# Patient Record
Sex: Male | Born: 1995 | Race: White | Hispanic: No | Marital: Single | Smoking: Former smoker
Health system: Southern US, Community
[De-identification: ages and names within clinical notes are randomized; demographics above are authoritative.]

---
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
## 2017-09-23 ENCOUNTER — Emergency Department (HOSPITAL_COMMUNITY)
Admission: EM | Admit: 2017-09-23 | Discharge: 2017-09-23 | Disposition: A | Discharge status: Home / Self Care | Payer: Self-pay | Attending: Emergency Medicine | Admitting: Emergency Medicine

## 2017-09-23 ENCOUNTER — Other Ambulatory Visit: Payer: Self-pay

## 2017-09-23 ENCOUNTER — Encounter (HOSPITAL_COMMUNITY): Payer: Self-pay | Admitting: Emergency Medicine

## 2017-09-23 DIAGNOSIS — F1510 Other stimulant abuse, uncomplicated: Secondary | ICD-10-CM | POA: Insufficient documentation

## 2017-09-23 DIAGNOSIS — Z79899 Other long term (current) drug therapy: Secondary | ICD-10-CM | POA: Insufficient documentation

## 2017-09-23 DIAGNOSIS — F17210 Nicotine dependence, cigarettes, uncomplicated: Secondary | ICD-10-CM | POA: Insufficient documentation

## 2017-09-23 LAB — COMPREHENSIVE METABOLIC PANEL
ALT: 180 U/L — ABNORMAL HIGH (ref 17–63)
ANION GAP: 10 (ref 5–15)
AST: 111 U/L — ABNORMAL HIGH (ref 15–41)
Albumin: 4.3 g/dL (ref 3.5–5.0)
Alkaline Phosphatase: 55 U/L (ref 38–126)
BUN: 26 mg/dL — ABNORMAL HIGH (ref 6–20)
CHLORIDE: 104 mmol/L (ref 101–111)
CO2: 21 mmol/L — AB (ref 22–32)
Calcium: 9.5 mg/dL (ref 8.9–10.3)
Creatinine, Ser: 1 mg/dL (ref 0.61–1.24)
GFR calc non Af Amer: >60 mL/min (ref >60)
Glucose, Bld: 89 mg/dL (ref 65–99)
Potassium: 3.7 mmol/L (ref 3.5–5.1)
SODIUM: 135 mmol/L (ref 135–145)
Total Bilirubin: 2.4 mg/dL — ABNORMAL HIGH (ref 0.3–1.2)
Total Protein: 7.5 g/dL (ref 6.5–8.1)

## 2017-09-23 LAB — CBC WITH DIFFERENTIAL/PLATELET
Basophils Absolute: 0 10*3/uL (ref 0.0–0.1)
Basophils Relative: 0 %
EOS ABS: 0.1 10*3/uL (ref 0.0–0.7)
Eosinophils Relative: 1 %
HCT: 37.4 % — ABNORMAL LOW (ref 39.0–52.0)
Hemoglobin: 12.7 g/dL — ABNORMAL LOW (ref 13.0–17.0)
LYMPHS ABS: 1.7 10*3/uL (ref 0.7–4.0)
LYMPHS PCT: 26 %
MCH: 30.2 pg (ref 26.0–34.0)
MCHC: 34 g/dL (ref 30.0–36.0)
MCV: 89 fL (ref 78.0–100.0)
Monocytes Absolute: 0.8 10*3/uL (ref 0.1–1.0)
Monocytes Relative: 11 %
Neutro Abs: 4 10*3/uL (ref 1.7–7.7)
Neutrophils Relative %: 62 %
Platelets: 154 10*3/uL (ref 150–400)
RBC: 4.2 MIL/uL — AB (ref 4.22–5.81)
RDW: 13.5 % (ref 11.5–15.5)
WBC: 6.6 10*3/uL (ref 4.0–10.5)

## 2017-09-23 LAB — RAPID URINE DRUG SCREEN, HOSP PERFORMED
Amphetamines: POSITIVE — AB
BENZODIAZEPINES: NOT DETECTED
Barbiturates: NOT DETECTED
Cocaine: NOT DETECTED
OPIATES: POSITIVE — AB
TETRAHYDROCANNABINOL: NOT DETECTED

## 2017-09-23 LAB — AMMONIA: Ammonia: 21 umol/L (ref 9–35)

## 2017-09-23 LAB — ETHANOL: Alcohol, Ethyl (B): <10 mg/dL (ref <10)

## 2017-09-23 NOTE — ED Notes (Signed)
Pt aware of need for urine specimen. Pt given urinal.

## 2017-09-23 NOTE — ED Notes (Addendum)
Patient is jerking and uneasy and says he is trying to remember what's going on and what day it is. Unable to get updated BP on him at this time.

## 2017-09-23 NOTE — ED Notes (Signed)
Bed: WA17 Expected date:  Expected time:  Means of arrival:  Comments: EMS 

## 2017-09-23 NOTE — ED Notes (Signed)
Patient arrives tearful and unable to remember the details from the last day.
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
## 2017-09-24 LAB — HIV ANTIBODY (ROUTINE TESTING W REFLEX): HIV Screen 4th Generation wRfx: NONREACTIVE

## 2017-09-24 LAB — HEPATITIS PANEL, ACUTE
HEP B S AG: NEGATIVE
Hep A IgM: NEGATIVE
Hep B C IgM: NEGATIVE

## 2019-03-07 ENCOUNTER — Emergency Department (HOSPITAL_COMMUNITY)
Admission: EM | Admit: 2019-03-07 | Discharge: 2019-03-07 | Disposition: A | Discharge status: Home / Self Care | Payer: Self-pay | Attending: Emergency Medicine | Admitting: Emergency Medicine

## 2019-03-07 ENCOUNTER — Ambulatory Visit (HOSPITAL_COMMUNITY)
Admission: EM | Admit: 2019-03-07 | Discharge: 2019-03-07 | Disposition: A | Discharge status: Home / Self Care | Payer: No Typology Code available for payment source | Attending: Emergency Medicine | Admitting: Emergency Medicine

## 2019-03-07 ENCOUNTER — Encounter (HOSPITAL_COMMUNITY): Payer: Self-pay | Admitting: Emergency Medicine

## 2019-03-07 DIAGNOSIS — F17210 Nicotine dependence, cigarettes, uncomplicated: Secondary | ICD-10-CM | POA: Insufficient documentation

## 2019-03-07 DIAGNOSIS — Z0441 Encounter for examination and observation following alleged adult rape: Secondary | ICD-10-CM | POA: Insufficient documentation

## 2019-03-07 DIAGNOSIS — T7421XA Adult sexual abuse, confirmed, initial encounter: Secondary | ICD-10-CM | POA: Insufficient documentation

## 2019-03-07 MED ORDER — ACETAMINOPHEN 500 MG PO TABS
1000.0000 mg | ORAL_TABLET | Freq: Once | ORAL | Status: AC
  Administered 2019-03-07: 15:00:00 1000 mg via ORAL
  Filled 2019-03-07: qty 2

## 2019-03-07 MED ORDER — CEFTRIAXONE SODIUM 250 MG IJ SOLR
250.0000 mg | Freq: Once | INTRAMUSCULAR | Status: AC
  Administered 2019-03-07: 16:00:00 250 mg via INTRAMUSCULAR
  Filled 2019-03-07: qty 250

## 2019-03-07 MED ORDER — AZITHROMYCIN 250 MG PO TABS
1000.0000 mg | ORAL_TABLET | Freq: Once | ORAL | Status: AC
Start: 2019-03-07 — End: 2019-03-07
  Administered 2019-03-07: 16:00:00 1000 mg via ORAL
  Filled 2019-03-07: qty 4

## 2019-03-07 MED ORDER — METRONIDAZOLE 500 MG PO TABS
2000.0000 mg | ORAL_TABLET | Freq: Once | ORAL | Status: AC
Start: 2019-03-07 — End: 2019-03-07
  Administered 2019-03-07: 16:00:00 2000 mg via ORAL
  Filled 2019-03-07: qty 4

## 2019-03-07 MED ORDER — LIDOCAINE HCL (PF) 1 % IJ SOLN
INTRAMUSCULAR | Status: AC
  Filled 2019-03-07: qty 5

## 2019-03-07 MED ORDER — LIDOCAINE HCL (PF) 1 % IJ SOLN
0.9000 mL | Freq: Once | INTRAMUSCULAR | Status: AC
  Administered 2019-03-07: 16:00:00 0.9 mL

## 2019-03-07 NOTE — ED Notes (Signed)
Tray ordered for patient by this RN. 
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
## 2019-03-07 NOTE — SANE Note (Signed)
Patient resting with eyes closed.  Rouses with verbal stimulation and falls back to sleep quickly.  Attempts made to discuss options and seek consent but patient unable to remain awake at this time.  The patient answered 'yes' when asked if he would like to get some rest before proceeding with the exam.  The patient requested that this RN stay with him for a bit.  Patient continues to sleep, rouses easily but quickly falls asleep.   This RN remained with the patient until 1245 pm and then notified the patient, the patient's RN, and the provider that this RN would come back when the patient was more alert.

## 2019-03-07 NOTE — ED Notes (Signed)
Sane nurse was called and is currently at [facility] will be coming here next.

## 2019-03-07 NOTE — ED Notes (Signed)
Medications given to SANE RN for administration.
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
## 2021-01-08 ENCOUNTER — Encounter (HOSPITAL_COMMUNITY): Payer: Self-pay

## 2021-01-08 ENCOUNTER — Emergency Department (HOSPITAL_COMMUNITY): Payer: Self-pay

## 2021-01-08 ENCOUNTER — Emergency Department (HOSPITAL_COMMUNITY)
Admission: EM | Admit: 2021-01-08 | Discharge: 2021-01-08 | Disposition: A | Discharge status: Home / Self Care | Payer: Self-pay | Attending: Emergency Medicine | Admitting: Emergency Medicine

## 2021-01-08 ENCOUNTER — Other Ambulatory Visit: Payer: Self-pay

## 2021-01-08 DIAGNOSIS — E162 Hypoglycemia, unspecified: Secondary | ICD-10-CM | POA: Insufficient documentation

## 2021-01-08 DIAGNOSIS — Z87891 Personal history of nicotine dependence: Secondary | ICD-10-CM | POA: Insufficient documentation

## 2021-01-08 DIAGNOSIS — T401X1A Poisoning by heroin, accidental (unintentional), initial encounter: Secondary | ICD-10-CM | POA: Insufficient documentation

## 2021-01-08 LAB — CBC WITH DIFFERENTIAL/PLATELET
Abs Immature Granulocytes: 0.12 10*3/uL — ABNORMAL HIGH (ref 0.00–0.07)
Basophils Absolute: 0 10*3/uL (ref 0.0–0.1)
Basophils Relative: 0 %
Eosinophils Absolute: 0 10*3/uL (ref 0.0–0.5)
Eosinophils Relative: 0 %
HCT: 42.8 % (ref 39.0–52.0)
Hemoglobin: 13.4 g/dL (ref 13.0–17.0)
Immature Granulocytes: 1 %
Lymphocytes Relative: 5 %
Lymphs Abs: 0.8 10*3/uL (ref 0.7–4.0)
MCH: 30.5 pg (ref 26.0–34.0)
MCHC: 31.3 g/dL (ref 30.0–36.0)
MCV: 97.3 fL (ref 80.0–100.0)
Monocytes Absolute: 1.1 10*3/uL — ABNORMAL HIGH (ref 0.1–1.0)
Monocytes Relative: 8 %
Neutro Abs: 12 10*3/uL — ABNORMAL HIGH (ref 1.7–7.7)
Neutrophils Relative %: 86 %
Platelets: 166 10*3/uL (ref 150–400)
RBC: 4.4 MIL/uL (ref 4.22–5.81)
RDW: 14.3 % (ref 11.5–15.5)
WBC: 14 10*3/uL — ABNORMAL HIGH (ref 4.0–10.5)
nRBC: 0 % (ref 0.0–0.2)

## 2021-01-08 LAB — RAPID URINE DRUG SCREEN, HOSP PERFORMED
Amphetamines: NOT DETECTED
Barbiturates: NOT DETECTED
Benzodiazepines: NOT DETECTED
Cocaine: NOT DETECTED
Opiates: NOT DETECTED
Tetrahydrocannabinol: POSITIVE — AB

## 2021-01-08 LAB — COMPREHENSIVE METABOLIC PANEL
ALT: 141 U/L — ABNORMAL HIGH (ref 0–44)
AST: 69 U/L — ABNORMAL HIGH (ref 15–41)
Albumin: 4 g/dL (ref 3.5–5.0)
Alkaline Phosphatase: 65 U/L (ref 38–126)
Anion gap: 7 (ref 5–15)
BUN: 19 mg/dL (ref 6–20)
CO2: 27 mmol/L (ref 22–32)
Calcium: 8.4 mg/dL — ABNORMAL LOW (ref 8.9–10.3)
Chloride: 106 mmol/L (ref 98–111)
Creatinine, Ser: 1.15 mg/dL (ref 0.61–1.24)
GFR, Estimated: >60 mL/min (ref >60)
Glucose, Bld: 69 mg/dL — ABNORMAL LOW (ref 70–99)
Potassium: 4.4 mmol/L (ref 3.5–5.1)
Sodium: 140 mmol/L (ref 135–145)
Total Bilirubin: 0.8 mg/dL (ref 0.3–1.2)
Total Protein: 7.5 g/dL (ref 6.5–8.1)

## 2021-01-08 LAB — CBG MONITORING, ED
Glucose-Capillary: 57 mg/dL — ABNORMAL LOW (ref 70–99)
Glucose-Capillary: 73 mg/dL (ref 70–99)

## 2021-01-08 LAB — ETHANOL: Alcohol, Ethyl (B): <10 mg/dL (ref <10)

## 2021-01-08 MED ORDER — NALOXONE HCL 4 MG/0.1ML NA LIQD
1.0000 | Freq: Once | NASAL | Status: DC
  Filled 2021-01-08: qty 4

## 2021-01-08 NOTE — ED Notes (Signed)
Pt given orange juice with two sugar packets as well as sandwich and popsickle. Pt witnessed finishing food and drink.
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
## 2021-01-08 NOTE — ED Provider Notes (Signed)
[facility]   CSN: [id number] Arrival date & time: 01/08/21  1848     History No chief complaint on file.   Bradley Dunn is a 25 y.o. male.  HPI 24 year old male presents after being found unresponsive by his girlfriend and being given intranasal Narcan.  The patient tells me that he has been clean for 7 months and "slipped up" and injected heroin today.  He states that his girlfriend apparently found him unresponsive and he does not know what happened after that.  Right now he feels tired but states he also worked all day in the heat.  He denies feeling short of breath or having any chest pain.  He does have a headache since he woke up.  No vomiting or nausea.  He was not trying to harm himself.  History reviewed. No pertinent past medical history.  There are no problems to display for this patient.   History reviewed. No pertinent surgical history.     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  Review of Systems   Review of Systems  Constitutional:  Negative for fever.  Respiratory:  Negative for shortness of breath.   Gastrointestinal:  Negative for nausea and vomiting.  Neurological:  Positive for headaches.  Psychiatric/Behavioral:  Negative for suicidal ideas.   All other systems reviewed and are negative.  Physical Exam Updated Vital Signs BP 139/70   Pulse 75   Temp 97.8 F (36.6 C) (Oral)   Resp 16   Ht 6' (1.829 m)   Wt 97.5 kg   SpO2 94%   BMI 29.16 kg/m   Physical  Exam Vitals and nursing note reviewed.  Constitutional:      General: He is not in acute distress.    Appearance: He is well-developed. He is not ill-appearing or diaphoretic.     Comments: Patient is awake, alert, though throughout the history/exam he is now starting to drift off and fall asleep unless I stimulate him and wake him up  HENT:     Head: Normocephalic and atraumatic.     Right Ear: External ear normal.     Left Ear: External ear normal.     Nose: Nose normal.  Eyes:     General:        Right eye: No discharge.        Left eye: No discharge.     Pupils: Pupils are equal, round, and reactive to light.  Cardiovascular:     Rate and Rhythm: Normal rate and regular rhythm.     Heart sounds: Normal heart sounds.  Pulmonary:     Effort: Pulmonary effort is normal.     Breath sounds: Normal breath sounds. No wheezing.  Abdominal:     Palpations: Abdomen is soft.     Tenderness: There is no abdominal tenderness.  Musculoskeletal:     Cervical back: Neck supple.  Skin:  General: Skin is warm and dry.  Neurological:     Mental Status: He is alert.  Psychiatric:        Mood and Affect: Mood is not anxious.    ED Results / Procedures / Treatments   Labs (all labs ordered are listed, but only abnormal results are displayed) Labs Reviewed  CBC WITH DIFFERENTIAL/PLATELET - Abnormal; Notable for the following components:      Result Value   WBC 14.0 (*)    Neutro Abs 12.0 (*)    Monocytes Absolute 1.1 (*)    Abs Immature Granulocytes 0.12 (*)    All other components within normal limits  COMPREHENSIVE METABOLIC PANEL - Abnormal; Notable for the following components:   Glucose, Bld 69 (*)    Calcium 8.4 (*)    AST 69 (*)    ALT 141 (*)    All other components within normal limits  RAPID URINE DRUG SCREEN, HOSP PERFORMED - Abnormal; Notable for the following components:   Tetrahydrocannabinol POSITIVE (*)    All other components within normal limits  CBG  MONITORING, ED - Abnormal; Notable for the following components:   Glucose-Capillary 57 (*)    All other components within normal limits  ETHANOL  CBG MONITORING, ED    EKG EKG Interpretation  Date/Time:  Thursday January 08 2021 19:49:30 EDT Ventricular Rate:  89 PR Interval:  155 QRS Duration: 88 QT Interval:  346 QTC Calculation: 421 R Axis:   80 Text Interpretation: Sinus rhythm Biatrial enlargement RSR' in V1 or V2, probably normal variant no significant change since 2019 Confirmed by Pricilla Loveless [phone]) on 01/08/2021 8:04:51 PM  Radiology DG Chest Portable 1 View  Result Date: 01/08/2021 CLINICAL DATA:  Patient was found unresponsive. Hypoxia after Narcan administration. EXAM: PORTABLE CHEST 1 VIEW COMPARISON:  None. FINDINGS: The heart size and mediastinal contours are within normal limits. Both lungs are clear. The visualized skeletal structures are unremarkable. IMPRESSION: No active disease. Electronically Signed   By: Burman Nieves M.D.   On: 01/08/2021 20:10    Procedures Procedures   Medications Ordered in ED Medications  naloxone (NARCAN) nasal spray 4 mg/0.1 mL (has no administration in time range)    ED Course  I have reviewed the triage vital signs and the nursing notes.  Pertinent labs & imaging results that were available during my care of the patient were reviewed by me and considered in my medical decision making (see chart for details).    MDM Rules/Calculators/A&P                          Patient presents after an accidental heroin overdose.  He was given Narcan by his girlfriend.  Since being here he has had some episodes where he is sleeping and some mild hypoxia but he has not become apneic or had significant decreased respirations.  He easily awakens and started on thing more Narcan is given.  He was observed and eventually came off oxygen and is a little more awake.  However he is feeling a little weak and lightheaded and found to be mildly  hypoglycemic, which came up with food including a sandwich.  He is feeling well enough for discharge.  I do not think he is going to need more Narcan given he has been observed for multiple hours and think he is stable for discharge home with return precautions. Final Clinical Impression(s) / ED Diagnoses Final diagnoses:  Accidental overdose of  heroin, initial encounter [facility])  Hypoglycemia    Rx / DC Orders ED Discharge Orders     None        Pricilla Loveless, MD 01/08/21 2258

## 2021-01-20 ENCOUNTER — Emergency Department (HOSPITAL_BASED_OUTPATIENT_CLINIC_OR_DEPARTMENT_OTHER)
Admission: EM | Admit: 2021-01-20 | Discharge: 2021-01-21 | Discharge status: Against Medical Advice | Payer: Self-pay | Attending: Emergency Medicine | Admitting: Emergency Medicine

## 2021-01-20 ENCOUNTER — Encounter (HOSPITAL_BASED_OUTPATIENT_CLINIC_OR_DEPARTMENT_OTHER): Payer: Self-pay | Admitting: Obstetrics and Gynecology

## 2021-01-20 ENCOUNTER — Other Ambulatory Visit: Payer: Self-pay

## 2021-01-20 ENCOUNTER — Emergency Department (HOSPITAL_BASED_OUTPATIENT_CLINIC_OR_DEPARTMENT_OTHER): Payer: Self-pay

## 2021-01-20 DIAGNOSIS — S0081XA Abrasion of other part of head, initial encounter: Secondary | ICD-10-CM | POA: Insufficient documentation

## 2021-01-20 DIAGNOSIS — Z20822 Contact with and (suspected) exposure to covid-19: Secondary | ICD-10-CM | POA: Insufficient documentation

## 2021-01-20 DIAGNOSIS — Z87891 Personal history of nicotine dependence: Secondary | ICD-10-CM | POA: Insufficient documentation

## 2021-01-20 DIAGNOSIS — W5503XA Scratched by cat, initial encounter: Secondary | ICD-10-CM | POA: Insufficient documentation

## 2021-01-20 DIAGNOSIS — S30811A Abrasion of abdominal wall, initial encounter: Secondary | ICD-10-CM | POA: Insufficient documentation

## 2021-01-20 DIAGNOSIS — A419 Sepsis, unspecified organism: Secondary | ICD-10-CM | POA: Insufficient documentation

## 2021-01-20 DIAGNOSIS — S20419A Abrasion of unspecified back wall of thorax, initial encounter: Secondary | ICD-10-CM | POA: Insufficient documentation

## 2021-01-20 LAB — COMPREHENSIVE METABOLIC PANEL
ALT: 114 U/L — ABNORMAL HIGH (ref 0–44)
AST: 70 U/L — ABNORMAL HIGH (ref 15–41)
Albumin: 4.3 g/dL (ref 3.5–5.0)
Alkaline Phosphatase: 62 U/L (ref 38–126)
Anion gap: 8 (ref 5–15)
BUN: 27 mg/dL — ABNORMAL HIGH (ref 6–20)
CO2: 26 mmol/L (ref 22–32)
Calcium: 9.2 mg/dL (ref 8.9–10.3)
Chloride: 95 mmol/L — ABNORMAL LOW (ref 98–111)
Creatinine, Ser: 1.22 mg/dL (ref 0.61–1.24)
GFR, Estimated: >60 mL/min (ref >60)
Glucose, Bld: 106 mg/dL — ABNORMAL HIGH (ref 70–99)
Potassium: 4.3 mmol/L (ref 3.5–5.1)
Sodium: 129 mmol/L — ABNORMAL LOW (ref 135–145)
Total Bilirubin: 1.4 mg/dL — ABNORMAL HIGH (ref 0.3–1.2)
Total Protein: 8 g/dL (ref 6.5–8.1)

## 2021-01-20 LAB — CBC WITH DIFFERENTIAL/PLATELET
Abs Immature Granulocytes: 0.01 10*3/uL (ref 0.00–0.07)
Basophils Absolute: 0 10*3/uL (ref 0.0–0.1)
Basophils Relative: 0 %
Eosinophils Absolute: 0 10*3/uL (ref 0.0–0.5)
Eosinophils Relative: 0 %
HCT: 45.3 % (ref 39.0–52.0)
Hemoglobin: 15.1 g/dL (ref 13.0–17.0)
Immature Granulocytes: 0 %
Lymphocytes Relative: 13 %
Lymphs Abs: 1 10*3/uL (ref 0.7–4.0)
MCH: 30.1 pg (ref 26.0–34.0)
MCHC: 33.3 g/dL (ref 30.0–36.0)
MCV: 90.4 fL (ref 80.0–100.0)
Monocytes Absolute: 0.8 10*3/uL (ref 0.1–1.0)
Monocytes Relative: 10 %
Neutro Abs: 5.8 10*3/uL (ref 1.7–7.7)
Neutrophils Relative %: 77 %
Platelets: 173 10*3/uL (ref 150–400)
RBC: 5.01 MIL/uL (ref 4.22–5.81)
RDW: 13.5 % (ref 11.5–15.5)
WBC: 7.6 10*3/uL (ref 4.0–10.5)
nRBC: 0 % (ref 0.0–0.2)

## 2021-01-20 LAB — URINALYSIS, ROUTINE W REFLEX MICROSCOPIC
Bilirubin Urine: NEGATIVE
Glucose, UA: NEGATIVE mg/dL
Hgb urine dipstick: NEGATIVE
Ketones, ur: 15 mg/dL — AB
Leukocytes,Ua: NEGATIVE
Nitrite: NEGATIVE
Protein, ur: 30 mg/dL — AB
Specific Gravity, Urine: 1.028 (ref 1.005–1.030)
pH: 5.5 (ref 5.0–8.0)

## 2021-01-20 LAB — RESP PANEL BY RT-PCR (FLU A&B, COVID) ARPGX2
Influenza A by PCR: NEGATIVE
Influenza B by PCR: NEGATIVE
SARS Coronavirus 2 by RT PCR: NEGATIVE

## 2021-01-20 LAB — LACTIC ACID, PLASMA: Lactic Acid, Venous: 1.2 mmol/L (ref 0.5–1.9)

## 2021-01-20 MED ORDER — VANCOMYCIN HCL 1750 MG/350ML IV SOLN
1750.0000 mg | Freq: Two times a day (BID) | INTRAVENOUS | Status: DC
  Filled 2021-01-20: qty 350

## 2021-01-20 MED ORDER — LACTATED RINGERS IV SOLN: INTRAVENOUS | Status: DC

## 2021-01-20 MED ORDER — SODIUM CHLORIDE 0.9 % IV SOLN
2.0000 g | Freq: Once | INTRAVENOUS | Status: AC
Start: 2021-01-20 — End: 2021-01-20
  Administered 2021-01-20: 2 g via INTRAVENOUS
  Filled 2021-01-20: qty 2

## 2021-01-20 MED ORDER — LACTATED RINGERS IV BOLUS (SEPSIS)
1000.0000 mL | Freq: Once | INTRAVENOUS | Status: AC
  Administered 2021-01-20: 1000 mL via INTRAVENOUS

## 2021-01-20 MED ORDER — VANCOMYCIN HCL IN DEXTROSE 1-5 GM/200ML-% IV SOLN
1000.0000 mg | INTRAVENOUS | Status: AC
  Administered 2021-01-21: 1000 mg via INTRAVENOUS
  Filled 2021-01-20: qty 200

## 2021-01-20 MED ORDER — LACTATED RINGERS IV BOLUS (SEPSIS)
1000.0000 mL | Freq: Once | INTRAVENOUS | Status: AC
  Administered 2021-01-21: 1000 mL via INTRAVENOUS

## 2021-01-20 MED ORDER — SODIUM CHLORIDE 0.9 % IV SOLN: 2.0000 g | Freq: Three times a day (TID) | INTRAVENOUS | Status: DC

## 2021-01-20 MED ORDER — LACTATED RINGERS IV BOLUS (SEPSIS): 1000.0000 mL | Freq: Once | INTRAVENOUS | Status: DC

## 2021-01-20 MED ORDER — METRONIDAZOLE 500 MG/100ML IV SOLN
500.0000 mg | Freq: Once | INTRAVENOUS | Status: AC
  Administered 2021-01-20: 500 mg via INTRAVENOUS
  Filled 2021-01-20: qty 100

## 2021-01-20 MED ORDER — ACETAMINOPHEN 325 MG PO TABS
650.0000 mg | ORAL_TABLET | Freq: Once | ORAL | Status: AC
  Administered 2021-01-20: 650 mg via ORAL
  Filled 2021-01-20: qty 2

## 2021-01-20 MED ORDER — VANCOMYCIN HCL IN DEXTROSE 1-5 GM/200ML-% IV SOLN: 1000.0000 mg | Freq: Once | INTRAVENOUS | Status: DC

## 2021-01-20 NOTE — ED Triage Notes (Signed)
Patient's cat attacked his dog and and he tried to stop the cat and he has an area concerning for infection on the left arm and scratches on his face

## 2021-01-20 NOTE — Progress Notes (Signed)
Pharmacy Antibiotic Note  Shafer Swamy is a 25 y.o. male admitted on 01/20/2021 with  wound infection .  Pharmacy has been consulted for cefepime and vancomycin dosing.  Plan: Cefepime 2g q8h Vancomycin 2g x 1 then Vancomycin 1750 IV every 12 hours.  Goal trough 15-20 mcg/mL. Pharmacy to adjust dosing / frequency as needed per renal function Follow up cultures for antibiotic streamlining opportunities     Temp (24hrs), Avg:101.3 F (38.5 C), Min:100.3 F (37.9 C), Max:102.1 F (38.9 C)  Recent Labs  Lab 01/20/21 2200  WBC 7.6    Estimated Creatinine Clearance: 118.9 mL/min (by C-G formula based on SCr of 1.15 mg/dL).    No Known Allergies  Antimicrobials this admission: cefepime 6/28 >> vancomycin 6/28 >> Metronidazole 6/28 >>  Microbiology results: Pending  Thank you for allowing pharmacy to be a part of this patient's care.  Delmar Landau, PharmD, BCPS 01/20/2021 11:13 PM ED Clinical Pharmacist -  [phone]

## 2021-01-20 NOTE — ED Provider Notes (Addendum)
[facility] EMERGENCY DEPT Provider Note   CSN: [id number] Arrival date & time: 01/20/21  1832     History Chief Complaint  Patient presents with   Animal Bite    Bradley Dunn is a 25 y.o. male.  Patient status post multiple cat scratches on Saturday.  Started to feel ill yesterday.  Patient also seen June 16 for accidental heroin overdose requiring Narcan.  Patient claimed that he had been dry for a long period of time until then.  Also the cat is feral.  But the cat is acting normal.  Has not run away.  Patient arrived here with fever and tachycardia.  Needing criteria for sepsis.      History reviewed. No pertinent past medical history.  There are no problems to display for this patient.   History reviewed. No pertinent surgical history.     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  Review of Systems   Review of Systems  Constitutional:  Positive for fatigue and fever. Negative for chills.  HENT:  Negative for ear pain and sore throat.   Eyes:  Negative for pain and visual disturbance.  Respiratory:  Negative for cough and shortness of breath.   Cardiovascular:  Negative for chest pain and palpitations.  Gastrointestinal:  Negative for abdominal pain and vomiting.  Genitourinary:  Negative for dysuria and hematuria.  Musculoskeletal:  Negative for arthralgias and back pain.  Skin:  Negative for color change and rash.  Neurological:  Negative for seizures and syncope.  All other systems reviewed and are  negative.  Physical Exam Updated Vital Signs BP 128/68   Pulse 92   Temp (!) 102.1 F (38.9 C) (Oral)   Resp 15   SpO2 95%   Physical Exam Vitals and nursing note reviewed.  Constitutional:      Appearance: Normal appearance. He is well-developed. He is ill-appearing.  HENT:     Head: Normocephalic and atraumatic.     Mouth/Throat:     Mouth: Mucous membranes are moist.  Eyes:     Conjunctiva/sclera: Conjunctivae normal.  Cardiovascular:     Rate and Rhythm: Normal rate and regular rhythm.     Heart sounds: No murmur heard. Pulmonary:     Effort: Pulmonary effort is normal. No respiratory distress.     Breath sounds: Normal breath sounds.  Abdominal:     Palpations: Abdomen is soft.  Musculoskeletal:        General: No swelling.     Cervical back: Neck supple.  Skin:    General: Skin is warm and dry.     Comments: Multiple scratches to the head forehead upper back and abdomen.  No lymphadenopathy.  No signs of any wound infection.  Neurological:     General: No focal deficit present.     Mental Status: He is alert and oriented to person, place, and time.     Cranial Nerves: No  cranial nerve deficit.     Sensory: No sensory deficit.    ED Results / Procedures / Treatments   Labs (all labs ordered are listed, but only abnormal results are displayed) Labs Reviewed  COMPREHENSIVE METABOLIC PANEL - Abnormal; Notable for the following components:      Result Value   Sodium 129 (*)    Chloride 95 (*)    Glucose, Bld 106 (*)    BUN 27 (*)    AST 70 (*)    ALT 114 (*)    Total Bilirubin 1.4 (*)    All other components within normal limits  URINALYSIS, ROUTINE W REFLEX MICROSCOPIC - Abnormal; Notable for the following components:   APPearance HAZY (*)    Ketones, ur 15 (*)    Protein, ur 30 (*)    All other components within normal limits  RESP PANEL BY RT-PCR (FLU A&B, COVID) ARPGX2  CULTURE, BLOOD (ROUTINE X 2)  CULTURE, BLOOD (ROUTINE X 2)  URINE CULTURE   LACTIC ACID, PLASMA  CBC WITH DIFFERENTIAL/PLATELET  LACTIC ACID, PLASMA  PROTIME-INR  APTT    EKG None ED ECG REPORT   Date: 01/21/2021  Rate: 105  Rhythm: sinus tachycardia  QRS Axis: normal  Intervals: normal  ST/T Wave abnormalities: early repolarization  Conduction Disutrbances:none  Narrative Interpretation:   Old EKG Reviewed: none available  I have personally reviewed the EKG tracing and agree with the computerized printout as noted.   Radiology DG Chest Port 1 View  Result Date: 01/20/2021 CLINICAL DATA:  Cat scratches on left arm and face, concern for infection EXAM: PORTABLE CHEST 1 VIEW COMPARISON:  01/08/2021 FINDINGS: No consolidation, features of edema, pneumothorax, or effusion. Pulmonary vascularity is normally distributed. The cardiomediastinal contours are unremarkable. No acute osseous or soft tissue abnormality. IMPRESSION: No acute cardiopulmonary abnormality. Electronically Signed   By: Kreg Shropshire M.D.   On: 01/20/2021 23:08    Procedures Procedures   CRITICAL CARE Performed by: Vanetta Mulders Total critical care time: 40 minutes Critical care time was exclusive of separately billable procedures and treating other patients. Critical care was necessary to treat or prevent imminent or life-threatening deterioration. Critical care was time spent personally by me on the following activities: development of treatment plan with patient and/or surrogate as well as nursing, discussions with consultants, evaluation of patient's response to treatment, examination of patient, obtaining history from patient or surrogate, ordering and performing treatments and interventions, ordering and review of laboratory studies, ordering and review of radiographic studies, pulse oximetry and re-evaluation of patient's condition.   Medications Ordered in ED Medications  lactated ringers infusion (has no administration in time range)  lactated ringers bolus 1,000 mL (1,000  mLs Intravenous New Bag/Given 01/20/21 2320)    And  lactated ringers bolus 1,000 mL (1,000 mLs Intravenous New Bag/Given 01/21/21 0022)    And  lactated ringers bolus 1,000 mL (has no administration in time range)  vancomycin (VANCOCIN) IVPB 1000 mg/200 mL premix (1,000 mg Intravenous New Bag/Given 01/21/21 0024)  ceFEPIme (MAXIPIME) 2 g in sodium chloride 0.9 % 100 mL IVPB (has no administration in time range)  vancomycin (VANCOREADY) IVPB 1750 mg/350 mL (has no administration in time range)  acetaminophen (TYLENOL) tablet 650 mg (has no administration in time range)  ceFEPIme (MAXIPIME) 2 g in sodium chloride 0.9 % 100 mL IVPB (0 g Intravenous Stopped 01/20/21 2325)  metroNIDAZOLE (FLAGYL) IVPB 500 mg (500 mg Intravenous New Bag/Given 01/20/21 2322)  acetaminophen (TYLENOL) tablet 650 mg (650  mg Oral Given 01/20/21 2234)    ED Course  I have reviewed the triage vital signs and the nursing notes.  Pertinent labs & imaging results that were available during my care of the patient were reviewed by me and considered in my medical decision making (see chart for details).    MDM Rules/Calculators/A&P                         Patient with presenting vital signs consistent with sepsis.  With tachycardia and fever.  Never had any hypotension.  Temp was 101.5.  Heart rate 113.  Respirations 16 oxygen sats 97%.  Patient claims that he has had multiple scratches from the cat which seem to be extensive.  Include the face upper back abdomen.  There is no lymphadenopathy to be consistent with cat scratch fever.  Patient was seen on June 16 for accidental heroin overdose.  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  He stated that he had been sober for a period of time.  COVID testing and influenza testing negative.  Blood cultures have been drawn and are pending.  Lactic acid not elevated no leukocytosis.  Patient did receive the 30 cc keep kilo fluid.  Patient received broad-spectrum antibiotics.  On these he  feels better.  Chest x-ray negative.  Patient without any abdominal pain.  Some slight liver function test abnormalities.  In addition patient has no heart murmurs.  Discussed with hospitalist regarding admission for sepsis.  Final Clinical Impression(s) / ED Diagnoses Final diagnoses:  Sepsis, due to unspecified organism, unspecified whether acute organ dysfunction present [facility])    Rx / DC Orders ED Discharge Orders     None        Vanetta Mulders, MD 01/21/21 2119    Vanetta Mulders, MD 01/21/21 0100  Patient insisting on leaving AMA.  We will cancel the admission.  He understands that we are concerned about about blood infection.  And that this could be life-threatening. the symptoms do not fit into clinical cat scratch fever.  Says he will return if anything gets worse.    Vanetta Mulders, MD 01/21/21 [phone]

## 2021-01-21 MED ORDER — ACETAMINOPHEN 325 MG PO TABS: 650.0000 mg | ORAL_TABLET | Freq: Once | ORAL | Status: DC

## 2021-01-21 NOTE — Discharge Instructions (Signed)
Patient insisted on leaving.  He says he feels better after the antibiotics.  But I explained to him that they may not hold were concerned about an infection in his blood.  He says he will return for anything new or worse.

## 2021-01-22 LAB — URINE CULTURE

## 2021-01-26 LAB — CULTURE, BLOOD (ROUTINE X 2)
Culture: NO GROWTH
Culture: NO GROWTH
Special Requests: ADEQUATE
Special Requests: ADEQUATE

## 2021-04-24 ENCOUNTER — Emergency Department (HOSPITAL_COMMUNITY)
Admission: EM | Admit: 2021-04-24 | Discharge: 2021-04-25 | Disposition: A | Discharge status: Home / Self Care | Payer: Medicaid Other | Attending: Emergency Medicine | Admitting: Emergency Medicine

## 2021-04-24 ENCOUNTER — Other Ambulatory Visit: Payer: Self-pay

## 2021-04-24 DIAGNOSIS — R410 Disorientation, unspecified: Secondary | ICD-10-CM

## 2021-04-24 DIAGNOSIS — Y900 Blood alcohol level of less than 20 mg/100 ml: Secondary | ICD-10-CM | POA: Insufficient documentation

## 2021-04-24 DIAGNOSIS — R4182 Altered mental status, unspecified: Secondary | ICD-10-CM | POA: Insufficient documentation

## 2021-04-24 DIAGNOSIS — F1110 Opioid abuse, uncomplicated: Secondary | ICD-10-CM | POA: Insufficient documentation

## 2021-04-24 DIAGNOSIS — Z87891 Personal history of nicotine dependence: Secondary | ICD-10-CM | POA: Insufficient documentation

## 2021-04-24 DIAGNOSIS — F1290 Cannabis use, unspecified, uncomplicated: Secondary | ICD-10-CM | POA: Insufficient documentation

## 2021-04-24 DIAGNOSIS — R739 Hyperglycemia, unspecified: Secondary | ICD-10-CM | POA: Insufficient documentation

## 2021-04-24 LAB — CBC
HCT: 40.9 % (ref 39.0–52.0)
Hemoglobin: 13.2 g/dL (ref 13.0–17.0)
MCH: 29.3 pg (ref 26.0–34.0)
MCHC: 32.3 g/dL (ref 30.0–36.0)
MCV: 90.7 fL (ref 80.0–100.0)
Platelets: 167 10*3/uL (ref 150–400)
RBC: 4.51 MIL/uL (ref 4.22–5.81)
RDW: 13.1 % (ref 11.5–15.5)
WBC: 5 10*3/uL (ref 4.0–10.5)
nRBC: 0 % (ref 0.0–0.2)

## 2021-04-24 LAB — COMPREHENSIVE METABOLIC PANEL
ALT: 138 U/L — ABNORMAL HIGH (ref 0–44)
AST: 78 U/L — ABNORMAL HIGH (ref 15–41)
Albumin: 4.3 g/dL (ref 3.5–5.0)
Alkaline Phosphatase: 70 U/L (ref 38–126)
Anion gap: 7 (ref 5–15)
BUN: 15 mg/dL (ref 6–20)
CO2: 27 mmol/L (ref 22–32)
Calcium: 9.3 mg/dL (ref 8.9–10.3)
Chloride: 105 mmol/L (ref 98–111)
Creatinine, Ser: 1.07 mg/dL (ref 0.61–1.24)
GFR, Estimated: >60 mL/min (ref >60)
Glucose, Bld: 110 mg/dL — ABNORMAL HIGH (ref 70–99)
Potassium: 3.8 mmol/L (ref 3.5–5.1)
Sodium: 139 mmol/L (ref 135–145)
Total Bilirubin: 1.1 mg/dL (ref 0.3–1.2)
Total Protein: 8.5 g/dL — ABNORMAL HIGH (ref 6.5–8.1)

## 2021-04-24 LAB — CBG MONITORING, ED: Glucose-Capillary: 98 mg/dL (ref 70–99)

## 2021-04-24 NOTE — ED Triage Notes (Signed)
Pt came from home via POV. C/c: confusion, AMS, Pt states "I feel spacey". PMH of possible hepatitis, heroin use, hospitalization for possible sepsis. Pt's girlfriend states that pt has had 4, 15 minute episodes in which pt does not know or remember what he is doing, this normally happens when pt goes to the bathroom. Pt denies hx of seizures. Pt endorses heroin use off and on for the last 10 years, last use was in June.

## 2021-04-24 NOTE — ED Notes (Signed)
PT HAS BLOOD IN MAIN LAB IF NEEDED  2 GOLDS 1 LAV 1 LG

## 2021-04-25 LAB — ETHANOL: Alcohol, Ethyl (B): <10 mg/dL (ref <10)

## 2021-04-25 LAB — CBG MONITORING, ED: Glucose-Capillary: 124 mg/dL — ABNORMAL HIGH (ref 70–99)

## 2021-04-25 LAB — RAPID URINE DRUG SCREEN, HOSP PERFORMED
Amphetamines: POSITIVE — AB
Barbiturates: NOT DETECTED
Benzodiazepines: NOT DETECTED
Cocaine: NOT DETECTED
Opiates: NOT DETECTED
Tetrahydrocannabinol: POSITIVE — AB

## 2021-04-25 NOTE — ED Provider Notes (Signed)
[facility]-EMERGENCY DEPT Provider Note   CSN: [id number] Arrival date & time: 04/24/21  2135     History Chief Complaint  Patient presents with   Altered Mental Status    Bradley Dunn is a 25 y.o. male.  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  His girlfriend is at bedside and supplements his history.  When asked what brought him in tonight, patient states "I do not really know."  Girlfriend describes several episodes of amnesia and confusion.  She states over the last 6 months he has had at least 4 episodes where he has been unaware of what is going on for approximately 15 minutes.  She states that this has happened and correlates with him going into the bathroom.  She states he comes out and has abnormal movements and does not seem to respond appropriately.  He is then amnestic to the event.  Patient denies any current drug use.  No history of seizures.  He has no physical complaints at this time.  Does state that he feels "spacey."  Also reports recent history of a sinus infection.  No fevers or known sick contacts.  Does endorse history of traumatic childhood and girlfriend questions whether he may have some PTSD.  No past medical history on file.  There are no problems to display for this patient.   No past surgical history on file.     Family History  Family history unknown: Yes    Social History   Tobacco Use   Smoking status: Former    Packs/day: 0.50    Types: Cigarettes   Smokeless tobacco: Never  Vaping Use   Vaping Use: Every day   Substances: Nicotine, Flavoring  Substance Use Topics   Alcohol use: No   Drug use: Yes    Types: IV    Comment: heroin    Home Medications Prior to Admission medications   Not on File    Allergies    Patient has no known allergies.  Review of Systems   Review of Systems  Constitutional:   Negative for fever.  Respiratory:  Negative for shortness of breath.   Cardiovascular:  Negative for chest pain.  Gastrointestinal:  Negative for abdominal pain, nausea and vomiting.  Psychiatric/Behavioral:  Positive for confusion and dysphoric mood. Negative for self-injury and suicidal ideas.   All other systems reviewed and are negative.  Physical Exam Updated Vital Signs BP (!) 132/94   Pulse 85   Temp 98.2 F (36.8 C) (Oral)   Resp 18   Ht 1.829 m (6')   Wt 97.5 kg   SpO2 100%   BMI 29.16 kg/m   Physical Exam Vitals and nursing note reviewed.  Constitutional:      Appearance: He is well-developed. He is not ill-appearing.  HENT:     Head: Normocephalic and atraumatic.     Nose: Nose normal.     Mouth/Throat:     Mouth: Mucous membranes are moist.  Eyes:     Pupils: Pupils are equal, round, and reactive to light.  Cardiovascular:     Rate and Rhythm: Normal rate and regular rhythm.     Heart sounds: Normal heart sounds. No murmur heard. Pulmonary:     Effort: Pulmonary effort is normal. No respiratory distress.     Breath sounds: Normal breath sounds. No wheezing.  Abdominal:  General: Bowel sounds are normal.     Palpations: Abdomen is soft.     Tenderness: There is no abdominal tenderness. There is no rebound.  Musculoskeletal:     Cervical back: Neck supple.     Right lower leg: No edema.     Left lower leg: No edema.  Lymphadenopathy:     Cervical: No cervical adenopathy.  Skin:    General: Skin is warm and dry.  Neurological:     Mental Status: He is alert and oriented to person, place, and time.     Comments: Cranial nerves II through XII intact, 5 out of 5 strength in all 4 extremities, no drift, no dysmetria to finger-nose-finger  Psychiatric:     Comments: Flat affect    ED Results / Procedures / Treatments   Labs (all labs ordered are listed, but only abnormal results are displayed) Labs Reviewed  COMPREHENSIVE METABOLIC PANEL - Abnormal;  Notable for the following components:      Result Value   Glucose, Bld 110 (*)    Total Protein 8.5 (*)    AST 78 (*)    ALT 138 (*)    All other components within normal limits  RAPID URINE DRUG SCREEN, HOSP PERFORMED - Abnormal; Notable for the following components:   Amphetamines POSITIVE (*)    Tetrahydrocannabinol POSITIVE (*)    All other components within normal limits  CBG MONITORING, ED - Abnormal; Notable for the following components:   Glucose-Capillary 124 (*)    All other components within normal limits  CBC  ETHANOL  CBG MONITORING, ED    EKG None  Radiology No results found.  Procedures Procedures   Medications Ordered in ED Medications - No data to display  ED Course  I have reviewed the triage vital signs and the nursing notes.  Pertinent labs & imaging results that were available during my care of the patient were reviewed by me and considered in my medical decision making (see chart for details).    MDM Rules/Calculators/A&P                           Patient presents with recurrent episodes of confusion.  He is overall nontoxic and vital signs are reassuring.  Symptoms are acute on chronic.  He did have an episode prior to arrival.  He is awake, alert, oriented, neurologic exam is reassuring.  History of polysubstance abuse but denies current abuse.  He seems to be amnestic to these events.  No evidence of syncope.  Seizure would be another consideration; however, there have been no witnessed tonic-clonic abnormalities.  Could be absence seizures.  Encephalopathy although would seem less likely given short duration of symptoms.  Labs obtained.  Stable elevated LFTs.  Otherwise labs are unremarkable.  UDS is positive for amphetamines and marijuana.  Marijuana can sometimes cause dissociation.  Overall work-up is reassuring.  Do not feel he needs imaging as he is nonfocal.  Recommend follow-up with neurology.  Avoid drug use.  After history, exam, and  medical workup I feel the patient has been appropriately medically screened and is safe for discharge home. Pertinent diagnoses were discussed with the patient. Patient was given return precautions.  Final Clinical Impression(s) / ED Diagnoses Final diagnoses:  Confusion    Rx / DC Orders ED Discharge Orders     None        Declyn Delsol, Mayer Masker, MD 04/25/21 [phone]

## 2021-04-25 NOTE — Discharge Instructions (Addendum)
You were seen today for ongoing episodes of confusion.  Your work-up is largely reassuring.  Follow-up with neurology as an outpatient.  Make sure that you are avoiding any drugs that may be contributing.

## 2021-05-01 ENCOUNTER — Other Ambulatory Visit (HOSPITAL_COMMUNITY): Payer: Self-pay

## 2021-05-01 ENCOUNTER — Other Ambulatory Visit: Payer: Self-pay

## 2021-05-01 ENCOUNTER — Other Ambulatory Visit (HOSPITAL_COMMUNITY)
Admission: EM | Admit: 2021-05-01 | Discharge: 2021-05-01 | Disposition: A | Discharge status: Home / Self Care | Payer: No Payment, Other | Attending: Psychiatry | Admitting: Psychiatry

## 2021-05-01 DIAGNOSIS — F17290 Nicotine dependence, other tobacco product, uncomplicated: Secondary | ICD-10-CM | POA: Diagnosis not present

## 2021-05-01 DIAGNOSIS — F410 Panic disorder [episodic paroxysmal anxiety] without agoraphobia: Secondary | ICD-10-CM | POA: Diagnosis not present

## 2021-05-01 DIAGNOSIS — F411 Generalized anxiety disorder: Secondary | ICD-10-CM | POA: Diagnosis not present

## 2021-05-01 DIAGNOSIS — F322 Major depressive disorder, single episode, severe without psychotic features: Secondary | ICD-10-CM | POA: Diagnosis present

## 2021-05-01 DIAGNOSIS — Z79899 Other long term (current) drug therapy: Secondary | ICD-10-CM | POA: Insufficient documentation

## 2021-05-01 DIAGNOSIS — Z20822 Contact with and (suspected) exposure to covid-19: Secondary | ICD-10-CM | POA: Insufficient documentation

## 2021-05-01 LAB — COMPREHENSIVE METABOLIC PANEL
ALT: 192 U/L — ABNORMAL HIGH (ref 0–44)
AST: 94 U/L — ABNORMAL HIGH (ref 15–41)
Albumin: 4.4 g/dL (ref 3.5–5.0)
Alkaline Phosphatase: 69 U/L (ref 38–126)
Anion gap: 13 (ref 5–15)
BUN: 21 mg/dL — ABNORMAL HIGH (ref 6–20)
CO2: 23 mmol/L (ref 22–32)
Calcium: 9.6 mg/dL (ref 8.9–10.3)
Chloride: 103 mmol/L (ref 98–111)
Creatinine, Ser: 1.69 mg/dL — ABNORMAL HIGH (ref 0.61–1.24)
GFR, Estimated: 57 mL/min — ABNORMAL LOW (ref >60)
Glucose, Bld: 88 mg/dL (ref 70–99)
Potassium: 3.9 mmol/L (ref 3.5–5.1)
Sodium: 139 mmol/L (ref 135–145)
Total Bilirubin: 1.1 mg/dL (ref 0.3–1.2)
Total Protein: 8.5 g/dL — ABNORMAL HIGH (ref 6.5–8.1)

## 2021-05-01 LAB — LIPID PANEL
Cholesterol: 111 mg/dL (ref 0–200)
HDL: 31 mg/dL — ABNORMAL LOW (ref >40)
LDL Cholesterol: 65 mg/dL (ref 0–99)
Total CHOL/HDL Ratio: 3.6 RATIO
Triglycerides: 74 mg/dL (ref <150)
VLDL: 15 mg/dL (ref 0–40)

## 2021-05-01 LAB — POCT URINE DRUG SCREEN - MANUAL ENTRY (I-SCREEN)
POC Amphetamine UR: POSITIVE — AB
POC Buprenorphine (BUP): NOT DETECTED
POC Cocaine UR: NOT DETECTED
POC Marijuana UR: POSITIVE — AB
POC Methadone UR: NOT DETECTED
POC Methamphetamine UR: POSITIVE — AB
POC Morphine: NOT DETECTED
POC Oxazepam (BZO): NOT DETECTED
POC Oxycodone UR: NOT DETECTED
POC Secobarbital (BAR): NOT DETECTED

## 2021-05-01 LAB — CBC WITH DIFFERENTIAL/PLATELET
Abs Immature Granulocytes: 0.03 10*3/uL (ref 0.00–0.07)
Basophils Absolute: 0 10*3/uL (ref 0.0–0.1)
Basophils Relative: 0 %
Eosinophils Absolute: 0.1 10*3/uL (ref 0.0–0.5)
Eosinophils Relative: 1 %
HCT: 42.2 % (ref 39.0–52.0)
Hemoglobin: 14.3 g/dL (ref 13.0–17.0)
Immature Granulocytes: 0 %
Lymphocytes Relative: 29 %
Lymphs Abs: 2.6 10*3/uL (ref 0.7–4.0)
MCH: 28.9 pg (ref 26.0–34.0)
MCHC: 33.9 g/dL (ref 30.0–36.0)
MCV: 85.4 fL (ref 80.0–100.0)
Monocytes Absolute: 0.8 10*3/uL (ref 0.1–1.0)
Monocytes Relative: 9 %
Neutro Abs: 5.5 10*3/uL (ref 1.7–7.7)
Neutrophils Relative %: 61 %
Platelets: 254 10*3/uL (ref 150–400)
RBC: 4.94 MIL/uL (ref 4.22–5.81)
RDW: 12.7 % (ref 11.5–15.5)
WBC: 9 10*3/uL (ref 4.0–10.5)
nRBC: 0 % (ref 0.0–0.2)

## 2021-05-01 LAB — HEMOGLOBIN A1C
Hgb A1c MFr Bld: 5.3 % (ref 4.8–5.6)
Mean Plasma Glucose: 105.41 mg/dL

## 2021-05-01 LAB — RESP PANEL BY RT-PCR (FLU A&B, COVID) ARPGX2
Influenza A by PCR: NEGATIVE
Influenza B by PCR: NEGATIVE
SARS Coronavirus 2 by RT PCR: NEGATIVE

## 2021-05-01 LAB — POC SARS CORONAVIRUS 2 AG -  ED: SARS Coronavirus 2 Ag: NEGATIVE

## 2021-05-01 MED ORDER — HYDROXYZINE HCL 25 MG PO TABS
25.0000 mg | ORAL_TABLET | Freq: Three times a day (TID) | ORAL | Status: DC | PRN
  Administered 2021-05-01 (×2): 25 mg via ORAL
  Filled 2021-05-01 (×2): qty 1
  Filled 2021-05-01: qty 15

## 2021-05-01 MED ORDER — ACETAMINOPHEN 325 MG PO TABS: 650.0000 mg | ORAL_TABLET | Freq: Four times a day (QID) | ORAL | Status: DC | PRN

## 2021-05-01 MED ORDER — TRAZODONE HCL 50 MG PO TABS
50.0000 mg | ORAL_TABLET | Freq: Every evening | ORAL | 0 refills | Status: DC | PRN
  Filled 2021-05-01: qty 30, 30d supply, fill #0

## 2021-05-01 MED ORDER — FLUOXETINE HCL 20 MG PO CAPS
20.0000 mg | ORAL_CAPSULE | Freq: Every day | ORAL | 0 refills | Status: AC
  Filled 2021-05-01: qty 30, 30d supply, fill #0

## 2021-05-01 MED ORDER — MAGNESIUM HYDROXIDE 400 MG/5ML PO SUSP: 30.0000 mL | Freq: Every day | ORAL | Status: DC | PRN

## 2021-05-01 MED ORDER — ALUM & MAG HYDROXIDE-SIMETH 200-200-20 MG/5ML PO SUSP: 30.0000 mL | ORAL | Status: DC | PRN

## 2021-05-01 MED ORDER — HYDROXYZINE HCL 25 MG PO TABS
25.0000 mg | ORAL_TABLET | Freq: Three times a day (TID) | ORAL | 0 refills | Status: AC | PRN
  Filled 2021-05-01: qty 30, 10d supply, fill #0

## 2021-05-01 MED ORDER — FLUOXETINE HCL 20 MG PO CAPS
20.0000 mg | ORAL_CAPSULE | Freq: Every day | ORAL | Status: DC
  Administered 2021-05-01: 20 mg via ORAL
  Filled 2021-05-01: qty 7
  Filled 2021-05-01: qty 1

## 2021-05-01 MED ORDER — TRAZODONE HCL 50 MG PO TABS
50.0000 mg | ORAL_TABLET | Freq: Every evening | ORAL | Status: DC | PRN
  Administered 2021-05-01: 50 mg via ORAL
  Filled 2021-05-01: qty 7
  Filled 2021-05-01: qty 1

## 2021-05-01 NOTE — ED Notes (Signed)
Patient left facility to go home. He did not want to stay in the in-patient unit. He was given medications for one week to take home, stated he did not have money to pay for meds. He signed out all belongings from locker 28.
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
## 2021-05-01 NOTE — ED Provider Notes (Addendum)
Behavioral Health Admission [facility]&P [facility] & OBS)  Date: 05/01/21 Patient Name: Bradley Dunn MRN: [id number] Chief Complaint:  Chief Complaint  Patient presents with  . Addiction Problem  . Anxiety       Diagnoses:  Final diagnoses:  GAD (generalized anxiety disorder)  MDD (major depressive disorder), single episode, severe , no psychosis (HCC)    HPI: Bradley Dunn is a 25 year old male with psychiatric history of polysubstance abuse and accidental overdose.  Patient presented voluntarily to [facility] for evaluation of worsening depression and panic attack.  Patient is accompanied by his girlfriend Ms. Donnella Sham [phone]).  Patient consented to Ms. Griffin participating in his assessment.  Patient is seen face-to-face and his chart was reviewed by this NP.  On approach patient is alert and oriented x4, he is restless but cooperative.  He is noted to be fidgety and unable to sit still.  Patient's mood is anxious with congruent affect his thought process is coherent.  He denies chest pain, shortness of breath, acute distress, headache, visual changes, or dizziness  Patient reports that he has been "on edge" for a couple of days.  He reports having several panic attacks and passing out due to the panic attacks. He reports that he is "depressed and struggling to deal with a lot of trauma." He reports that he had a disagreement with his girlfriend tonight; he became "triggered" when she raised her voiced and yelled at him. He reports that he was unable to calm down, started hyperventilating, and that he had panic attack. He reported that he punched the wall with his right fist  during panic attack. He endorses depressive symptoms of worthlessness, hopelessness, guilt, anxiety, negative self thoughts and poor sleep. Bradley says he would like to be admitted for treatment of "depression, anxiety, and trauma."  [name] denies current suicidal ideation, he shares that he has suicidal ideations "on and  off" SI is worst when using illicit drugs. He denies HI/AVH/paranoia. He admits to a hx of polysubstance abuse but reports he has been sober since January 08, 2021.   Patient denies pain to his hands, he was offered to be transported to the ED for xray several times but he declined. He is able to move his right hand and was noted to be tabbing on the table withhis right hand without discomfort.    He   PHQ 2-9:  Flowsheet Row ED from 05/01/2021 in [facility]  Thoughts that you would be better off dead, or of hurting yourself in some way Several days  PHQ-9 Total Score 12       Flowsheet Row ED from 05/01/2021 in [facility] ED from 04/24/2021 in [facility] ED from 01/20/2021 in [facility]  C-SSRS RISK CATEGORY No Risk No Risk No Risk        Total Time spent with patient: 20 minutes  Musculoskeletal  Strength & Muscle Tone: within normal limits Gait & Station: normal Patient leans: Right  Psychiatric Specialty Exam  Presentation General Appearance: Appropriate for Environment; Casual  Eye Contact:Good  Speech:Clear and Coherent; Normal Rate  Speech Volume:Normal  Handedness:Right   Mood and Affect  Mood:Euthymic  Affect:Appropriate; Congruent   Thought Process  Thought Processes:Coherent; Goal Directed; Linear  Descriptions of Associations:Intact  Orientation:Full (Time, Place and Person)  Thought Content:Logical; WDL    Hallucinations:Hallucinations: None  Ideas of Reference:None  Suicidal Thoughts:Suicidal Thoughts: No  Homicidal Thoughts:Homicidal Thoughts: No   Sensorium  Memory:Immediate Good; Recent Good; Remote Good  Judgment:Good  Insight:Good   Executive Functions  Concentration:Good  Attention Span:Good  Recall:Good  Fund of Knowledge:Good  Language:Good   Psychomotor Activity  Psychomotor Activity:Psychomotor  Activity: Normal   Assets  Assets:Communication Skills; Desire for Improvement; Financial Resources/Insurance; Housing; Intimacy; Leisure Time; Physical Health; Resilience; Social Support; Talents/Skills   Sleep  Sleep:Sleep: Fair Number of Hours of Sleep: 6   Nutritional Assessment (For OBS and FBC admissions only) Has the patient had a weight loss or gain of 10 pounds or more in the last 3 months?: No Has the patient had a decrease in food intake/or appetite?: No Does the patient have dental problems?: No Does the patient have eating habits or behaviors that may be indicators of an eating disorder including binging or inducing vomiting?: No Has the patient recently lost weight without trying?: 0 Has the patient been eating poorly because of a decreased appetite?: 0 Malnutrition Screening Tool Score: 0   Physical Exam Vitals and nursing note reviewed.  Constitutional:      Appearance: He is well-developed.  HENT:     Head: Normocephalic and atraumatic.  Eyes:     Conjunctiva/sclera: Conjunctivae normal.  Cardiovascular:     Rate and Rhythm: Normal rate.  Pulmonary:     Effort: Pulmonary effort is normal. No respiratory distress.     Breath sounds: Normal breath sounds.  Abdominal:     Palpations: Abdomen is soft.     Tenderness: There is no abdominal tenderness.  Musculoskeletal:        General: Normal range of motion.     Cervical back: Normal range of motion.  Skin:    General: Skin is warm and dry.  Neurological:     Mental Status: He is alert and oriented to person, place, and time.  Psychiatric:        Attention and Perception: Attention and perception normal.        Mood and Affect: Mood is anxious and depressed. Affect is tearful.        Speech: Speech normal.        Behavior: Behavior normal. Behavior is cooperative.        Thought Content: Thought content normal.        Cognition and Memory: Cognition normal.   Review of Systems  Constitutional:  Negative.   HENT: Negative.    Eyes: Negative.   Respiratory: Negative.    Cardiovascular: Negative.   Gastrointestinal: Negative.   Genitourinary: Negative.   Musculoskeletal: Negative.   Skin: Negative.   Neurological: Negative.   Endo/Heme/Allergies: Negative.   Psychiatric/Behavioral:  Positive for depression. The patient is nervous/anxious.    Blood pressure (!) 127/92, pulse 90, temperature (!) 97.4 F (36.3 C), temperature source Oral, resp. rate 18, SpO2 96 %. There is no height or weight on file to calculate BMI.  Past Psychiatric History:  polysubstance abuse and accidental overdose.   Is the patient at risk to self? No  Has the patient been a risk to self in the past 6 months? No .    Has the patient been a risk to self within the distant past? No   Is the patient a risk to others? No   Has the patient been a risk to others in the past 6 months? No   Has the patient been a risk to others within the distant past? No   Past Medical History: No past medical history on file. No past surgical history on file.  Family History:  Family History  Family history unknown: Yes    Social History:  Social History   Socioeconomic History  . Marital status: Single    Spouse name: Not on file  . Number of children: Not on file  . Years of education: Not on file  . Highest education level: Not on file  Occupational History  . Not on file  Tobacco Use  . Smoking status: Former    Packs/day: 0.50    Types: Cigarettes  . Smokeless tobacco: Never  Vaping Use  . Vaping Use: Every day  . Substances: Nicotine, Flavoring  Substance and Sexual Activity  . Alcohol use: No  . Drug use: Yes    Types: IV    Comment: heroin  . Sexual activity: Never  Other Topics Concern  . Not on file  Social History Narrative  . Not on file   Social Determinants of Health   Financial Resource Strain: Not on file  Food Insecurity: Not on file  Transportation Needs: Not on file  Physical  Activity: Not on file  Stress: Not on file  Social Connections: Not on file  Intimate Partner Violence: Not on file    SDOH:  SDOH Screenings   Alcohol Screen: Not on file  Depression (PHQ2-9): Medium Risk  . PHQ-2 Score: 12  Financial Resource Strain: Not on file  Food Insecurity: Not on file  Housing: Not on file  Physical Activity: Not on file  Social Connections: Not on file  Stress: Not on file  Tobacco Use: Medium Risk  . Smoking Tobacco Use: Former  . Smokeless Tobacco Use: Never  Transportation Needs: Not on file    Last Labs:  Admission on 05/01/2021, Discharged on 05/01/2021  Component Date Value Ref Range Status  . SARS Coronavirus 2 by RT PCR 05/01/2021 NEGATIVE  NEGATIVE Final   Comment: (NOTE) SARS-CoV-2 target nucleic acids are NOT DETECTED.  The SARS-CoV-2 RNA is generally detectable in upper respiratory specimens during the acute phase of infection. The lowest concentration of SARS-CoV-2 viral copies this assay can detect is 138 copies/mL. A negative result does not preclude SARS-Cov-2 infection and should not be used as the sole basis for treatment or other patient management decisions. A negative result may occur with  improper specimen collection/handling, submission of specimen other than nasopharyngeal swab, presence of viral mutation(s) within the areas targeted by this assay, and inadequate number of viral copies(<138 copies/mL). A negative result must be combined with clinical observations, patient history, and epidemiological information. The expected result is Negative.  Fact Sheet for Patients:  BloggerCourse.com  Fact Sheet for Healthcare Providers:  SeriousBroker.it  This test is no                          t yet approved or cleared by the [place] FDA and  has been authorized for detection and/or diagnosis of SARS-CoV-2 by FDA under an Emergency Use Authorization (EUA). This EUA  will remain  in effect (meaning this test can be used) for the duration of the COVID-19 declaration under Section 564(b)(1) of the Act, 21 U.S.C.section 360bbb-3(b)(1), unless the authorization is terminated  or revoked sooner.      . Influenza A by PCR 05/01/2021 NEGATIVE  NEGATIVE Final  . Influenza B by PCR 05/01/2021 NEGATIVE  NEGATIVE Final   Comment: (NOTE) The Xpert Xpress SARS-CoV-2/FLU/RSV plus assay is intended as an aid in the diagnosis of influenza from Nasopharyngeal swab specimens  and should not be used as a sole basis for treatment. Nasal washings and aspirates are unacceptable for Xpert Xpress SARS-CoV-2/FLU/RSV testing.  Fact Sheet for Patients: BloggerCourse.com  Fact Sheet for Healthcare Providers: SeriousBroker.it  This test is not yet approved or cleared by the [place] FDA and has been authorized for detection and/or diagnosis of SARS-CoV-2 by FDA under an Emergency Use Authorization (EUA). This EUA will remain in effect (meaning this test can be used) for the duration of the COVID-19 declaration under Section 564(b)(1) of the Act, 21 U.S.C. section 360bbb-3(b)(1), unless the authorization is terminated or revoked.  Performed at [facility], [address], [place], [place] [address]   . WBC 05/01/2021 9.0  4.0 - 10.5 K/uL Final  . RBC 05/01/2021 4.94  4.22 - 5.81 MIL/uL Final  . Hemoglobin 05/01/2021 14.3  13.0 - 17.0 g/dL Final  . HCT 65/78/4696 42.2  39.0 - 52.0 % Final  . MCV 05/01/2021 85.4  80.0 - 100.0 fL Final  . MCH 05/01/2021 28.9  26.0 - 34.0 pg Final  . MCHC 05/01/2021 33.9  30.0 - 36.0 g/dL Final  . RDW 29/52/8413 12.7  11.5 - 15.5 % Final  . Platelets 05/01/2021 254  150 - 400 K/uL Final  . nRBC 05/01/2021 0.0  0.0 - 0.2 % Final  . Neutrophils Relative % 05/01/2021 61  % Final  . Neutro Abs 05/01/2021 5.5  1.7 - 7.7 K/uL Final  . Lymphocytes Relative 05/01/2021 29  % Final   . Lymphs Abs 05/01/2021 2.6  0.7 - 4.0 K/uL Final  . Monocytes Relative 05/01/2021 9  % Final  . Monocytes Absolute 05/01/2021 0.8  0.1 - 1.0 K/uL Final  . Eosinophils Relative 05/01/2021 1  % Final  . Eosinophils Absolute 05/01/2021 0.1  0.0 - 0.5 K/uL Final  . Basophils Relative 05/01/2021 0  % Final  . Basophils Absolute 05/01/2021 0.0  0.0 - 0.1 K/uL Final  . Immature Granulocytes 05/01/2021 0  % Final  . Abs Immature Granulocytes 05/01/2021 0.03  0.00 - 0.07 K/uL Final   Performed at [facility], [address], [place], [place] [address]  . Sodium 05/01/2021 139  135 - 145 mmol/L Final  . Potassium 05/01/2021 3.9  3.5 - 5.1 mmol/L Final  . Chloride 05/01/2021 103  98 - 111 mmol/L Final  . CO2 05/01/2021 23  22 - 32 mmol/L Final  . Glucose, Bld 05/01/2021 88  70 - 99 mg/dL Final   Glucose reference range applies only to samples taken after fasting for at least 8 hours.  . BUN 05/01/2021 21 (A) 6 - 20 mg/dL Final  . Creatinine, Ser 05/01/2021 1.69 (A) 0.61 - 1.24 mg/dL Final  . Calcium 02/72/5366 9.6  8.9 - 10.3 mg/dL Final  . Total Protein 05/01/2021 8.5 (A) 6.5 - 8.1 g/dL Final  . Albumin 44/09/4740 4.4  3.5 - 5.0 g/dL Final  . AST 59/56/3875 94 (A) 15 - 41 U/L Final  . ALT 05/01/2021 192 (A) 0 - 44 U/L Final  . Alkaline Phosphatase 05/01/2021 69  38 - 126 U/L Final  . Total Bilirubin 05/01/2021 1.1  0.3 - 1.2 mg/dL Final  . GFR, Estimated 05/01/2021 57 (A) >60 mL/min Final   Comment: (NOTE) Calculated using the CKD-EPI Creatinine Equation (2021)   . Anion gap 05/01/2021 13  5 - 15 Final   Performed at [facility], [address], [place], [place] [address]  . Hgb A1c  MFr Bld 05/01/2021 5.3  4.8 - 5.6 % Final   Comment: (NOTE) Pre diabetes:          5.7%-6.4%  Diabetes:              >6.4%  Glycemic control for   <7.0% adults with diabetes   . Mean Plasma Glucose 05/01/2021 105.41  mg/dL Final   Performed at [facility], [address]  [place], [place] [address]  . POC Amphetamine UR 05/01/2021 Positive (A) NONE DETECTED (Cut Off Level 1000 ng/mL) Preliminary  . POC Secobarbital (BAR) 05/01/2021 None Detected  NONE DETECTED (Cut Off Level 300 ng/mL) Preliminary  . POC Buprenorphine (BUP) 05/01/2021 None Detected  NONE DETECTED (Cut Off Level 10 ng/mL) Preliminary  . POC Oxazepam (BZO) 05/01/2021 None Detected  NONE DETECTED (Cut Off Level 300 ng/mL) Preliminary  . POC Cocaine UR 05/01/2021 None Detected  NONE DETECTED (Cut Off Level 300 ng/mL) Preliminary  . POC Methamphetamine UR 05/01/2021 Positive (A) NONE DETECTED (Cut Off Level 1000 ng/mL) Preliminary  . POC Morphine 05/01/2021 None Detected  NONE DETECTED (Cut Off Level 300 ng/mL) Preliminary  . POC Oxycodone UR 05/01/2021 None Detected  NONE DETECTED (Cut Off Level 100 ng/mL) Preliminary  . POC Methadone UR 05/01/2021 None Detected  NONE DETECTED (Cut Off Level 300 ng/mL) Preliminary  . POC Marijuana UR 05/01/2021 Positive (A) NONE DETECTED (Cut Off Level 50 ng/mL) Preliminary  . SARS Coronavirus 2 Ag 05/01/2021 Negative  Negative Preliminary  . Cholesterol 05/01/2021 111  0 - 200 mg/dL Final  . Triglycerides 05/01/2021 74  <150 mg/dL Final  . HDL 60/45/4098 31 (A) >40 mg/dL Final  . Total CHOL/HDL Ratio 05/01/2021 3.6  RATIO Final  . VLDL 05/01/2021 15  0 - 40 mg/dL Final  . LDL Cholesterol 05/01/2021 65  0 - 99 mg/dL Final   Comment:        Total Cholesterol/HDL:CHD Risk Coronary Heart Disease Risk Table                     Men   Women  1/2 Average Risk   3.4   3.3  Average Risk       5.0   4.4  2 X Average Risk   9.6   7.1  3 X Average Risk  23.4   11.0        Use the calculated Patient Ratio above and the CHD Risk Table to determine the patient's CHD Risk.        ATP III CLASSIFICATION (LDL):  <100     mg/dL   Optimal  119-147  mg/dL   Near or Above                    Optimal  130-159  mg/dL   Borderline  829-562  mg/dL   High  >130     mg/dL   Very  High Performed at [facility], [address], [place], [place] [address]   Admission on 04/24/2021, Discharged on 04/25/2021  Component Date Value Ref Range Status  . Glucose-Capillary 04/24/2021 98  70 - 99 mg/dL Final   Glucose reference range applies only to samples taken after fasting for at least 8 hours.  . Sodium 04/24/2021 139  135 - 145 mmol/L Final  . Potassium 04/24/2021 3.8  3.5 - 5.1 mmol/L Final  . Chloride 04/24/2021 105  98 - 111 mmol/L Final  . CO2 04/24/2021 27  22 -  32 mmol/L Final  . Glucose, Bld 04/24/2021 110 (A) 70 - 99 mg/dL Final   Glucose reference range applies only to samples taken after fasting for at least 8 hours.  . BUN 04/24/2021 15  6 - 20 mg/dL Final  . Creatinine, Ser 04/24/2021 1.07  0.61 - 1.24 mg/dL Final  . Calcium 23/36/1224 9.3  8.9 - 10.3 mg/dL Final  . Total Protein 04/24/2021 8.5 (A) 6.5 - 8.1 g/dL Final  . Albumin 49/75/3005 4.3  3.5 - 5.0 g/dL Final  . AST 05/27/1116 78 (A) 15 - 41 U/L Final  . ALT 04/24/2021 138 (A) 0 - 44 U/L Final  . Alkaline Phosphatase 04/24/2021 70  38 - 126 U/L Final  . Total Bilirubin 04/24/2021 1.1  0.3 - 1.2 mg/dL Final  . GFR, Estimated 04/24/2021 >60  >60 mL/min Final   Comment: (NOTE) Calculated using the CKD-EPI Creatinine Equation (2021)   . Anion gap 04/24/2021 7  5 - 15 Final   Performed at [facility], [address], [place], [place] [address]  . WBC 04/24/2021 5.0  4.0 - 10.5 K/uL Final  . RBC 04/24/2021 4.51  4.22 - 5.81 MIL/uL Final  . Hemoglobin 04/24/2021 13.2  13.0 - 17.0 g/dL Final  . HCT 14/04/3012 40.9  39.0 - 52.0 % Final  . MCV 04/24/2021 90.7  80.0 - 100.0 fL Final  . MCH 04/24/2021 29.3  26.0 - 34.0 pg Final  . MCHC 04/24/2021 32.3  30.0 - 36.0 g/dL Final  . RDW 14/38/8875 13.1  11.5 - 15.5 % Final  . Platelets 04/24/2021 167  150 - 400 K/uL Final  . nRBC 04/24/2021 0.0  0.0 - 0.2 % Final   Performed at [facility], [address],  [place], [place] [address]  . Glucose-Capillary 04/25/2021 124 (A) 70 - 99 mg/dL Final   Glucose reference range applies only to samples taken after fasting for at least 8 hours.  . Opiates 04/25/2021 NONE DETECTED  NONE DETECTED Final  . Cocaine 04/25/2021 NONE DETECTED  NONE DETECTED Final  . Benzodiazepines 04/25/2021 NONE DETECTED  NONE DETECTED Final  . Amphetamines 04/25/2021 POSITIVE (A) NONE DETECTED Final  . Tetrahydrocannabinol 04/25/2021 POSITIVE (A) NONE DETECTED Final  . Barbiturates 04/25/2021 NONE DETECTED  NONE DETECTED Final   Comment: (NOTE) DRUG SCREEN FOR MEDICAL PURPOSES ONLY.  IF CONFIRMATION IS NEEDED FOR ANY PURPOSE, NOTIFY LAB WITHIN 5 DAYS.  LOWEST DETECTABLE LIMITS FOR URINE DRUG SCREEN Drug Class                     Cutoff (ng/mL) Amphetamine and metabolites    1000 Barbiturate and metabolites    200 Benzodiazepine                 200 Tricyclics and metabolites     300 Opiates and metabolites        300 Cocaine and metabolites        300 THC                            50 Performed at [facility], [address] [place], [place] [address]   . Alcohol, Ethyl (B) 04/25/2021 <10  <10 mg/dL Final   Comment: (NOTE) Lowest detectable limit for serum alcohol is 10 mg/dL.  For medical purposes only. Performed at [facility], [address] [place], [place] [address]   Admission  on 01/20/2021, Discharged on 01/21/2021  Component Date Value Ref Range Status  . SARS Coronavirus 2 by RT PCR 01/20/2021 NEGATIVE  NEGATIVE Final   Comment: (NOTE) SARS-CoV-2 target nucleic acids are NOT DETECTED.  The SARS-CoV-2 RNA is generally detectable in upper respiratory specimens during the acute phase of infection. The lowest concentration of SARS-CoV-2 viral copies this assay can detect is 138 copies/mL. A negative result does not preclude SARS-Cov-2 infection and should not be used as the sole basis for treatment or other patient  management decisions. A negative result may occur with  improper specimen collection/handling, submission of specimen other than nasopharyngeal swab, presence of viral mutation(s) within the areas targeted by this assay, and inadequate number of viral copies(<138 copies/mL). A negative result must be combined with clinical observations, patient history, and epidemiological information. The expected result is Negative.  Fact Sheet for Patients:  BloggerCourse.com  Fact Sheet for Healthcare Providers:  SeriousBroker.it  This test is no                          t yet approved or cleared by the [place] FDA and  has been authorized for detection and/or diagnosis of SARS-CoV-2 by FDA under an Emergency Use Authorization (EUA). This EUA will remain  in effect (meaning this test can be used) for the duration of the COVID-19 declaration under Section 564(b)(1) of the Act, 21 U.S.C.section 360bbb-3(b)(1), unless the authorization is terminated  or revoked sooner.      . Influenza A by PCR 01/20/2021 NEGATIVE  NEGATIVE Final  . Influenza B by PCR 01/20/2021 NEGATIVE  NEGATIVE Final   Comment: (NOTE) The Xpert Xpress SARS-CoV-2/FLU/RSV plus assay is intended as an aid in the diagnosis of influenza from Nasopharyngeal swab specimens and should not be used as a sole basis for treatment. Nasal washings and aspirates are unacceptable for Xpert Xpress SARS-CoV-2/FLU/RSV testing.  Fact Sheet for Patients: BloggerCourse.com  Fact Sheet for Healthcare Providers: SeriousBroker.it  This test is not yet approved or cleared by the [place] FDA and has been authorized for detection and/or diagnosis of SARS-CoV-2 by FDA under an Emergency Use Authorization (EUA). This EUA will remain in effect (meaning this test can be used) for the duration of the COVID-19 declaration under Section  564(b)(1) of the Act, 21 U.S.C. section 360bbb-3(b)(1), unless the authorization is terminated or revoked.  Performed at [facility], [address], [place], [place] [address]   . Lactic Acid, Venous 01/20/2021 1.2  0.5 - 1.9 mmol/L Final   Performed at [facility], [address] [place], [place] [address]  . Sodium 01/20/2021 129 (A) 135 - 145 mmol/L Final  . Potassium 01/20/2021 4.3  3.5 - 5.1 mmol/L Final  . Chloride 01/20/2021 95 (A) 98 - 111 mmol/L Final  . CO2 01/20/2021 26  22 - 32 mmol/L Final  . Glucose, Bld 01/20/2021 106 (A) 70 - 99 mg/dL Final   Glucose reference range applies only to samples taken after fasting for at least 8 hours.  . BUN 01/20/2021 27 (A) 6 - 20 mg/dL Final  . Creatinine, Ser 01/20/2021 1.22  0.61 - 1.24 mg/dL Final  . Calcium 09/81/1914 9.2  8.9 - 10.3 mg/dL Final  . Total Protein 01/20/2021 8.0  6.5 - 8.1 g/dL Final  . Albumin 78/29/5621 4.3  3.5 - 5.0 g/dL Final  . AST 30/86/5784 70 (A) 15 - 41 U/L Final  . ALT 01/20/2021 114 (A)  0 - 44 U/L Final  . Alkaline Phosphatase 01/20/2021 62  38 - 126 U/L Final  . Total Bilirubin 01/20/2021 1.4 (A) 0.3 - 1.2 mg/dL Final  . GFR, Estimated 01/20/2021 >60  >60 mL/min Final   Comment: (NOTE) Calculated using the CKD-EPI Creatinine Equation (2021)   . Anion gap 01/20/2021 8  5 - 15 Final   Performed at [facility], [address] [place], [place] [address]  . WBC 01/20/2021 7.6  4.0 - 10.5 K/uL Final  . RBC 01/20/2021 5.01  4.22 - 5.81 MIL/uL Final  . Hemoglobin 01/20/2021 15.1  13.0 - 17.0 g/dL Final  . HCT 72/53/6644 45.3  39.0 - 52.0 % Final  . MCV 01/20/2021 90.4  80.0 - 100.0 fL Final  . MCH 01/20/2021 30.1  26.0 - 34.0 pg Final  . MCHC 01/20/2021 33.3  30.0 - 36.0 g/dL Final  . RDW 03/47/4259 13.5  11.5 - 15.5 % Final  . Platelets 01/20/2021 173  150 - 400 K/uL Final  . nRBC 01/20/2021 0.0  0.0 - 0.2 % Final  . Neutrophils Relative %  01/20/2021 77  % Final  . Neutro Abs 01/20/2021 5.8  1.7 - 7.7 K/uL Final  . Lymphocytes Relative 01/20/2021 13  % Final  . Lymphs Abs 01/20/2021 1.0  0.7 - 4.0 K/uL Final  . Monocytes Relative 01/20/2021 10  % Final  . Monocytes Absolute 01/20/2021 0.8  0.1 - 1.0 K/uL Final  . Eosinophils Relative 01/20/2021 0  % Final  . Eosinophils Absolute 01/20/2021 0.0  0.0 - 0.5 K/uL Final  . Basophils Relative 01/20/2021 0  % Final  . Basophils Absolute 01/20/2021 0.0  0.0 - 0.1 K/uL Final  . Immature Granulocytes 01/20/2021 0  % Final  . Abs Immature Granulocytes 01/20/2021 0.01  0.00 - 0.07 K/uL Final   Performed at [facility], [address], [place], [place] [address]  . Specimen Description 01/20/2021    Final                   Value:BLOOD BLOOD LEFT HAND Performed at [facility], [address], [place], [place] [address]   . Special Requests 01/20/2021    Final                   Value:BOTTLES DRAWN AEROBIC AND ANAEROBIC Blood Culture adequate volume Performed at [facility], [address] [place], [place] [address]   . Culture 01/20/2021    Final                   Value:NO GROWTH 5 DAYS Performed at [facility] [facility], [address] [place], [place] [address]   . Report Status 01/20/2021 01/26/2021 FINAL   Final  . Specimen Description 01/20/2021    Final                   Value:BLOOD BOTTLES DRAWN AEROBIC AND ANAEROBIC Performed at [facility], [address] [place], [place] [address]   . Special Requests 01/20/2021    Final                   Value:Blood Culture adequate volume Performed at [facility], [address] [place], [place] [address]   . Culture 01/20/2021    Final                   Value:NO GROWTH 5 DAYS Performed at [facility], [address] [place], [place] [address]   . Report Status 01/20/2021 01/26/2021 FINAL   Final  . Color, Urine  01/20/2021 YELLOW  YELLOW Final  . APPearance 01/20/2021 HAZY (A) CLEAR Final  . Specific Gravity, Urine 01/20/2021 1.028  1.005 - 1.030 Final  . pH 01/20/2021 5.5  5.0 - 8.0 Final  . Glucose, UA 01/20/2021 NEGATIVE  NEGATIVE mg/dL Final  . Hgb urine dipstick 01/20/2021 NEGATIVE  NEGATIVE Final  . Bilirubin Urine 01/20/2021 NEGATIVE  NEGATIVE Final  . Ketones, ur 01/20/2021 15 (A) NEGATIVE mg/dL Final  . Protein, ur 60/45/4098 30 (A) NEGATIVE mg/dL Final  . Nitrite 11/91/4782 NEGATIVE  NEGATIVE Final  . Glori Luis 01/20/2021 NEGATIVE  NEGATIVE Final  . RBC / HPF 01/20/2021 0-5  0 - 5 RBC/hpf Final  . WBC, UA 01/20/2021 0-5  0 - 5 WBC/hpf Final  . Mucus 01/20/2021 PRESENT   Final  . Amorphous Crystal 01/20/2021 PRESENT   Final   Performed at [facility], [address], [place], [place] [address]  . Specimen Description 01/20/2021    Final                   Value:IN/OUT CATH URINE Performed at [facility], [address] [place], [place] [address]   . Special Requests 01/20/2021    Final                   Value:NONE Performed at [facility], [address] [place], [place] [address]   . Culture 01/20/2021 MULTIPLE SPECIES PRESENT, SUGGEST RECOLLECTION (A)  Final  . Report Status 01/20/2021 01/22/2021 FINAL   Final  Admission on 01/08/2021, Discharged on 01/08/2021  Component Date Value Ref Range Status  . WBC 01/08/2021 14.0 (A) 4.0 - 10.5 K/uL Final  . RBC 01/08/2021 4.40  4.22 - 5.81 MIL/uL Final  . Hemoglobin 01/08/2021 13.4  13.0 - 17.0 g/dL Final  . HCT 62/95/2841 42.8  39.0 - 52.0 % Final  . MCV 01/08/2021 97.3  80.0 - 100.0 fL Final  . MCH 01/08/2021 30.5  26.0 - 34.0 pg Final  . MCHC 01/08/2021 31.3  30.0 - 36.0 g/dL Final  . RDW 32/44/0102 14.3  11.5 - 15.5 % Final  . Platelets 01/08/2021 166  150 - 400 K/uL Final  . nRBC 01/08/2021 0.0  0.0 - 0.2 % Final  . Neutrophils Relative % 01/08/2021 86  % Final   . Neutro Abs 01/08/2021 12.0 (A) 1.7 - 7.7 K/uL Final  . Lymphocytes Relative 01/08/2021 5  % Final  . Lymphs Abs 01/08/2021 0.8  0.7 - 4.0 K/uL Final  . Monocytes Relative 01/08/2021 8  % Final  . Monocytes Absolute 01/08/2021 1.1 (A) 0.1 - 1.0 K/uL Final  . Eosinophils Relative 01/08/2021 0  % Final  . Eosinophils Absolute 01/08/2021 0.0  0.0 - 0.5 K/uL Final  . Basophils Relative 01/08/2021 0  % Final  . Basophils Absolute 01/08/2021 0.0  0.0 - 0.1 K/uL Final  . Immature Granulocytes 01/08/2021 1  % Final  . Abs Immature Granulocytes 01/08/2021 0.12 (A) 0.00 - 0.07 K/uL Final   Performed at [facility], [address], [place], [place] [address]  . Sodium 01/08/2021 140  135 - 145 mmol/L Final  . Potassium 01/08/2021 4.4  3.5 - 5.1 mmol/L Final  . Chloride 01/08/2021 106  98 - 111 mmol/L Final  . CO2 01/08/2021 27  22 - 32 mmol/L Final  .  Glucose, Bld 01/08/2021 69 (A) 70 - 99 mg/dL Final   Glucose reference range applies only to samples taken after fasting for at least 8 hours.  . BUN 01/08/2021 19  6 - 20 mg/dL Final  . Creatinine, Ser 01/08/2021 1.15  0.61 - 1.24 mg/dL Final  . Calcium 38/46/6599 8.4 (A) 8.9 - 10.3 mg/dL Final  . Total Protein 01/08/2021 7.5  6.5 - 8.1 g/dL Final  . Albumin 35/70/1779 4.0  3.5 - 5.0 g/dL Final  . AST 39/09/90 69 (A) 15 - 41 U/L Final  . ALT 01/08/2021 141 (A) 0 - 44 U/L Final  . Alkaline Phosphatase 01/08/2021 65  38 - 126 U/L Final  . Total Bilirubin 01/08/2021 0.8  0.3 - 1.2 mg/dL Final  . GFR, Estimated 01/08/2021 >60  >60 mL/min Final   Comment: (NOTE) Calculated using the CKD-EPI Creatinine Equation (2021)   . Anion gap 01/08/2021 7  5 - 15 Final   Performed at [facility], [address] [place], [place] [address]  . Alcohol, Ethyl (B) 01/08/2021 <10  <10 mg/dL Final   Comment: (NOTE) Lowest detectable limit for serum alcohol is 10 mg/dL.  For medical purposes only. Performed at [facility], [address] [place], [place] [address]   . Opiates 01/08/2021 NONE DETECTED  NONE DETECTED Final  . Cocaine 01/08/2021 NONE DETECTED  NONE DETECTED Final  . Benzodiazepines 01/08/2021 NONE DETECTED  NONE DETECTED Final  . Amphetamines 01/08/2021 NONE DETECTED  NONE DETECTED Final  . Tetrahydrocannabinol 01/08/2021 POSITIVE (A) NONE DETECTED Final  . Barbiturates 01/08/2021 NONE DETECTED  NONE DETECTED Final   Comment: (NOTE) DRUG SCREEN FOR MEDICAL PURPOSES ONLY.  IF CONFIRMATION IS NEEDED FOR ANY PURPOSE, NOTIFY LAB WITHIN 5 DAYS.  LOWEST DETECTABLE LIMITS FOR URINE DRUG SCREEN Drug Class                     Cutoff (ng/mL) Amphetamine and metabolites    1000 Barbiturate and metabolites    200 Benzodiazepine                 200 Tricyclics and metabolites     300 Opiates and metabolites        300 Cocaine and metabolites        300 THC                            50 Performed at [facility], [address], [place], [place] [address]   . Glucose-Capillary 01/08/2021 57 (A) 70 - 99 mg/dL Final   Glucose reference range applies only to samples taken after fasting for at least 8 hours.  . Glucose-Capillary 01/08/2021 73  70 - 99 mg/dL Final   Glucose reference range applies only to samples taken after fasting for at least 8 hours.    Allergies: Patient has no known allergies.  PTA Medications: (Not in a hospital admission)   Medical Decision Making  Patient presents with complain of worsening depression and anxiety with multiple panic attacks. Patient will be admitted to [facility] for stabilization.  -hydroxyzine 25mg  TID prn -Trazodone 50mg  QHS prn -Prozac 20mg /day for depression -review available labs     Recommendations  Based on my evaluation the patient does not appear to have an emergency medical condition.  , NP 05/01/21  8:08 PM

## 2021-05-01 NOTE — ED Notes (Signed)
Pt A&O x 4, anxious and restless, presents for drug abuse problem.  Polysubstance abuse.  UDS positive for Amphetamines, Methamphetamines and  THC.  Pt requesting Detox.  Skin search completed, monitoring for safety.

## 2021-05-01 NOTE — Discharge Instructions (Addendum)

## 2021-05-01 NOTE — Progress Notes (Signed)
CSW met with patient to learn more about resources patient would like to be connected with. Patient expressed needing trauma informed mental health services, outpatient substance use resources and needing support groups/community of people that are going through the same lived experience as he is.    CSW provided resources to [facility] walk in hours for med management and therapy, [employer] support groups/counseling and [facility] substance use outpatient counseling.  Patient is not interested in inpatient, residential or detox resources at this time.    Romona Murdy, LCSW, [place] Social Worker  [facility]

## 2021-05-01 NOTE — ED Notes (Signed)
Told nurse about pulse and blood pressure numbers
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
## 2021-05-07 ENCOUNTER — Other Ambulatory Visit (HOSPITAL_COMMUNITY): Payer: Self-pay

## 2021-06-22 ENCOUNTER — Ambulatory Visit (HOSPITAL_COMMUNITY)
Admission: EM | Admit: 2021-06-22 | Discharge: 2021-06-22 | Disposition: A | Discharge status: Home / Self Care | Payer: No Payment, Other | Attending: Psychiatry | Admitting: Psychiatry

## 2021-06-22 DIAGNOSIS — B1920 Unspecified viral hepatitis C without hepatic coma: Secondary | ICD-10-CM | POA: Insufficient documentation

## 2021-06-22 DIAGNOSIS — F331 Major depressive disorder, recurrent, moderate: Secondary | ICD-10-CM | POA: Insufficient documentation

## 2021-06-22 NOTE — ED Provider Notes (Signed)
Behavioral Health Urgent Care Medical Screening Exam  Patient Name: Bradley Dunn MRN: [id number] Date of Evaluation: 06/22/21 Chief Complaint:   Diagnosis:  Final diagnoses:  MDD (major depressive disorder), recurrent episode, moderate (HCC)    History of Present illness: Bradley Dunn is a 25 y.o. male patient presented to [facility] as a walk in  accompanied by girlfriend and his girlfriend's daughter with complaints of "I need some help with my mood stability".  Bradley Dunn, 25 y.o., male patient seen face to face by this provider, consulted with Dr. Bronwen Betters; and chart reviewed on 06/22/21.  On evaluation Bradley Dunn reports he has a history of depression and substance abuse.  He was recently seen and admitted to [facility] for inpatient psychiatric admission on 9/302/2022.  Patient did not follow-up with outpatient resources.  Reports he has a "colorful past".  Reports he has been to jail and is currently on probation.  Denies any alcohol or substance use at this time, he has been sober for 4 months.  Reports he does not have any current triggers other than stress associated to the thought of losing his girlfriend and his family that he now has back in his life.  States "I am done using drugs I am not going back to that life".  Patient is also concerned with his Hepatitis C diagnosis.  States he took a medication regimen to cure his hepatitis but did not take the last dose.  Reports his girlfriend tells him at times he reverts to "childlike behavior".  States he has watched videos of himself clapping his hands and slapping his legs and he has no recollection of that happening. States his girlfriend and daughter are being seen today for "mental issues".  States while he was here he thought it would be a good idea to be seen for outpatient resources.  During evaluation Bradley Dunn is in sitting position in no acute distress.  He makes good eye contact.  Speech is clear, coherent, normal rate and  tone.He is alert/oriented x 4 and cooperative.  He taps his foot on the floor during the assessment, he is visibly anxious.  Reports a increase in depression over the past month.  Reports he has had a quick temper in the past and he is afraid that he will become that way again.  Denies any concerns with appetite or sleep.  He is thought process is coherent and relevant; There is no indication that he is currently responding to internal/external stimuli or experiencing delusional thought content; and he has denied suicidal/self-harm/homicidal ideation, psychosis, and paranoia.  Patient contracts for safety.  Provided outpatient psychiatric resources for [facility] behavioral health outpatient services on the second floor including open access walk-in hours.  Patient states he would rather go to [facility] services in [facility].  He declined referral to [facility] program.  Discussed seeing a primary care physician related to his "childlike behavior" and forgetfulness.  Resources provided for [facility] and wellness.   At this time Bradley Dunn is educated and verbalizes understanding of mental health resources and other crisis services in the community. He is instructed to call 911 and present to the nearest emergency room should she experience any suicidal/homicidal ideation, auditory/visual/hallucinations, or detrimental worsening of his  mental health condition.    Psychiatric Specialty Exam  Presentation  General Appearance:Appropriate for Environment; Casual  Eye Contact:Good  Speech:Clear and Coherent; Normal Rate  Speech Volume:Normal  Handedness:Right   Mood and Affect  Mood:Anxious; Depressed  Affect:Congruent   Thought Process  Thought Processes:Coherent  Descriptions of Associations:Intact  Orientation:Full (Time, Place and Person)  Thought Content:Logical  Diagnosis of Schizophrenia or Schizoaffective disorder in past: No   Hallucinations:None  Ideas of  Reference:None  Suicidal Thoughts:No  Homicidal Thoughts:No   Sensorium  Memory:Immediate Good; Recent Good; Remote Good  Judgment:Good  Insight:Good   Executive Functions  Concentration:Good  Attention Span:Good  [place]  Language:Good   Psychomotor Activity  Psychomotor Activity:Normal   Assets  Assets:Communication Skills; Desire for Improvement; Intimacy; Housing; Catering manager; Physical Health; Resilience; Social Support; Vocational/Educational   Sleep  Sleep:Good  Number of hours: 6   No data recorded  Physical Exam: Physical Exam Vitals and nursing note reviewed.  Constitutional:      General: He is not in acute distress.    Appearance: Normal appearance. He is well-developed.  HENT:     Head: Normocephalic and atraumatic.  Eyes:     General:        Right eye: No discharge.        Left eye: No discharge.     Conjunctiva/sclera: Conjunctivae normal.  Cardiovascular:     Rate and Rhythm: Normal rate.     Heart sounds: No murmur heard. Pulmonary:     Effort: Pulmonary effort is normal. No respiratory distress.  Musculoskeletal:        General: Normal range of motion.     Cervical back: Normal range of motion.  Skin:    Coloration: Skin is not jaundiced or pale.  Neurological:     Mental Status: He is alert and oriented to person, place, and time.  Psychiatric:        Attention and Perception: Attention and perception normal.        Mood and Affect: Mood is anxious and depressed.        Speech: Speech normal.        Behavior: Behavior normal. Behavior is cooperative.        Thought Content: Thought content normal.        Cognition and Memory: Cognition normal.        Judgment: Judgment normal.   Review of Systems  Constitutional: Negative.   HENT: Negative.    Eyes: Negative.   Respiratory: Negative.    Cardiovascular: Negative.   Musculoskeletal: Negative.   Skin: Negative.    Neurological: Negative.   Psychiatric/Behavioral:  Positive for depression. The patient is nervous/anxious.   Blood pressure (!) 156/87, pulse 78, temperature 98.2 F (36.8 C), temperature source Oral, resp. rate 18, SpO2 95 %. There is no height or weight on file to calculate BMI.  Musculoskeletal: Strength & Muscle Tone: within normal limits Gait & Station: normal Patient leans: N/A   [facility] Discharge Disposition for Follow up and Recommendations: Based on my evaluation the patient does not appear to have an emergency medical condition and can be discharged with resources and follow up care in outpatient services for Medication Management, Partial Hospitalization Program, Individual Therapy, and Group Therapy  Discharge patient.   Resources provided for [facility] behavioral health outpatient services on second floor including open access walk-in hours.  Provided resources for [facility] in [place].  Provided primary care resources for [facility] health and wellness. No evidence of imminent risk to self or others at present.    Patient does not meet criteria for psychiatric inpatient admission. Discussed crisis plan, support from social network, calling 911, coming to the Emergency Department, and  calling Suicide Hotline.   Revonda Humphrey, NP 06/22/2021, 6:19 PM

## 2021-06-22 NOTE — Progress Notes (Signed)
Attempted to review AVS with patient, but patient had left the building.  Daughter present in lobby to get AVS.  Reviewed information with daughter and daughter verbalized understanding.  Per daughter, parents are in the car and ready to leave.  Patient discharged in stable condition.

## 2021-06-22 NOTE — Progress Notes (Signed)
Patient is a 25 year old that presents this date voluntary requesting assistance with mood instability. Patient denies any S/I, H/I or AVH. Patient (see chart history) was seen on 9/30 at [facility] and required a inpatient admission at that time due to AMS and depression associated with polysubstance use. Patient denies that he followed up with OP referrals at that time although states he has been maintaining his sobriety from all substances since then. 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 Patient states he was diagnosed with hepatitis C while incarcerated last year but never received treatment. Patient is concerned in reference to his last set on labs on 10/7 which indicated elevated liver enzymes. This date patient reports ongoing mood instability for the last month with frequent mood swings, RLS and anxiety. Girlfriend Donnella Sham) is present who provides collateral and states patient reverts back to "acting child like making strange sounds and signs" for extended periods  of time although does not recall these incidents. Patient is requesting assistance with resources this date to stabilize.

## 2021-06-22 NOTE — Discharge Instructions (Signed)

## 2021-06-26 ENCOUNTER — Telehealth (HOSPITAL_COMMUNITY): Payer: Self-pay

## 2021-06-26 ENCOUNTER — Encounter (HOSPITAL_COMMUNITY): Payer: Self-pay

## 2021-06-26 ENCOUNTER — Emergency Department (HOSPITAL_COMMUNITY)
Admission: EM | Admit: 2021-06-26 | Discharge: 2021-06-26 | Disposition: A | Discharge status: Home / Self Care | Payer: Medicaid Other | Attending: Emergency Medicine | Admitting: Emergency Medicine

## 2021-06-26 DIAGNOSIS — F1999 Other psychoactive substance use, unspecified with unspecified psychoactive substance-induced disorder: Secondary | ICD-10-CM

## 2021-06-26 DIAGNOSIS — Z20822 Contact with and (suspected) exposure to covid-19: Secondary | ICD-10-CM | POA: Insufficient documentation

## 2021-06-26 DIAGNOSIS — F1910 Other psychoactive substance abuse, uncomplicated: Secondary | ICD-10-CM | POA: Diagnosis present

## 2021-06-26 DIAGNOSIS — F1990 Other psychoactive substance use, unspecified, uncomplicated: Secondary | ICD-10-CM

## 2021-06-26 DIAGNOSIS — F1220 Cannabis dependence, uncomplicated: Secondary | ICD-10-CM | POA: Diagnosis present

## 2021-06-26 DIAGNOSIS — F1110 Opioid abuse, uncomplicated: Secondary | ICD-10-CM | POA: Diagnosis present

## 2021-06-26 DIAGNOSIS — F1510 Other stimulant abuse, uncomplicated: Secondary | ICD-10-CM | POA: Diagnosis present

## 2021-06-26 DIAGNOSIS — Z79899 Other long term (current) drug therapy: Secondary | ICD-10-CM | POA: Insufficient documentation

## 2021-06-26 DIAGNOSIS — F17210 Nicotine dependence, cigarettes, uncomplicated: Secondary | ICD-10-CM | POA: Insufficient documentation

## 2021-06-26 DIAGNOSIS — F12280 Cannabis dependence with cannabis-induced anxiety disorder: Secondary | ICD-10-CM | POA: Insufficient documentation

## 2021-06-26 DIAGNOSIS — F1924 Other psychoactive substance dependence with psychoactive substance-induced mood disorder: Secondary | ICD-10-CM | POA: Insufficient documentation

## 2021-06-26 DIAGNOSIS — Y900 Blood alcohol level of less than 20 mg/100 ml: Secondary | ICD-10-CM | POA: Insufficient documentation

## 2021-06-26 DIAGNOSIS — Z008 Encounter for other general examination: Secondary | ICD-10-CM

## 2021-06-26 LAB — COMPREHENSIVE METABOLIC PANEL
ALT: 84 U/L — ABNORMAL HIGH (ref 0–44)
AST: 92 U/L — ABNORMAL HIGH (ref 15–41)
Albumin: 4.6 g/dL (ref 3.5–5.0)
Alkaline Phosphatase: 69 U/L (ref 38–126)
Anion gap: 7 (ref 5–15)
BUN: 27 mg/dL — ABNORMAL HIGH (ref 6–20)
CO2: 25 mmol/L (ref 22–32)
Calcium: 9.4 mg/dL (ref 8.9–10.3)
Chloride: 103 mmol/L (ref 98–111)
Creatinine, Ser: 1.01 mg/dL (ref 0.61–1.24)
GFR, Estimated: >60 mL/min (ref >60)
Glucose, Bld: 114 mg/dL — ABNORMAL HIGH (ref 70–99)
Potassium: 3.9 mmol/L (ref 3.5–5.1)
Sodium: 135 mmol/L (ref 135–145)
Total Bilirubin: 1.5 mg/dL — ABNORMAL HIGH (ref 0.3–1.2)
Total Protein: 8.2 g/dL — ABNORMAL HIGH (ref 6.5–8.1)

## 2021-06-26 LAB — RAPID URINE DRUG SCREEN, HOSP PERFORMED
Amphetamines: POSITIVE — AB
Barbiturates: NOT DETECTED
Benzodiazepines: NOT DETECTED
Cocaine: NOT DETECTED
Opiates: POSITIVE — AB
Tetrahydrocannabinol: POSITIVE — AB

## 2021-06-26 LAB — ACETAMINOPHEN LEVEL: Acetaminophen (Tylenol), Serum: <10 ug/mL — ABNORMAL LOW (ref 10–30)

## 2021-06-26 LAB — CBC
HCT: 39.8 % (ref 39.0–52.0)
Hemoglobin: 13.4 g/dL (ref 13.0–17.0)
MCH: 29.6 pg (ref 26.0–34.0)
MCHC: 33.7 g/dL (ref 30.0–36.0)
MCV: 88.1 fL (ref 80.0–100.0)
Platelets: 192 10*3/uL (ref 150–400)
RBC: 4.52 MIL/uL (ref 4.22–5.81)
RDW: 14 % (ref 11.5–15.5)
WBC: 8.7 10*3/uL (ref 4.0–10.5)
nRBC: 0 % (ref 0.0–0.2)

## 2021-06-26 LAB — RESP PANEL BY RT-PCR (FLU A&B, COVID) ARPGX2
Influenza A by PCR: NEGATIVE
Influenza B by PCR: NEGATIVE
SARS Coronavirus 2 by RT PCR: NEGATIVE

## 2021-06-26 LAB — ETHANOL: Alcohol, Ethyl (B): <10 mg/dL (ref <10)

## 2021-06-26 LAB — SALICYLATE LEVEL: Salicylate Lvl: <7 mg/dL — ABNORMAL LOW (ref 7.0–30.0)

## 2021-06-26 NOTE — ED Provider Notes (Signed)
[facility]-EMERGENCY DEPT Provider Note   CSN: [id number] Arrival date & time: 06/26/21  0402     History No chief complaint on file.   Bradley Dunn is a 25 y.o. male presenting under IVC for psychiatric eval.   Level 5 caveat due to psychiatric illness.  Patient states he does not know why he is in the ER.  He states he is not having any SI, HI, AVH.  He does not know why his girlfriend took out IVC paperwork on him.  Per police, girlfriend has called police multiple times due to patient's behavior, including a call earlier today.  It was recommended she take out IVC paperwork due to patient's substance use, he has been awake for a day or 2, and because patient is giving the girlfriend instructions about spreading his ashes.  She is worried about SI.  Patient with a history of methamphetamine and opioid abuse, was reportedly sober for several years, relapsed recently.  Additional history obtained per chart review.  Patient with a history of depression.  Has been at the urgent care twice in the past 2 months since relapse.   HPI     History reviewed. No pertinent past medical history.  Patient Active Problem List   Diagnosis Date Noted   MDD (major depressive disorder), single episode, severe (HCC) 05/01/2021    History reviewed. No pertinent surgical history.     Family History  Family history unknown: Yes    Social History   Tobacco Use   Smoking status: Former    Packs/day: 0.50    Types: Cigarettes   Smokeless tobacco: Never  Vaping Use   Vaping Use: Every day   Substances: Nicotine, Flavoring  Substance Use Topics   Alcohol use: No   Drug use: Yes    Types: IV    Comment: heroin    Home Medications Prior to Admission medications   Medication Sig Start Date End Date Taking? Authorizing Provider  FLUoxetine (PROZAC) 20 MG capsule Take 1 capsule (20 mg total) by mouth daily. 05/02/21   Lenard Lance, FNP  hydrocerin (EUCERIN) CREA  Apply 1 application topically daily as needed (For dry skin).    [provider]  hydrOXYzine (ATARAX/VISTARIL) 25 MG tablet Take 1 tablet (25 mg total) by mouth 3 (three) times daily as needed for anxiety. 05/01/21   Lenard Lance, FNP  traZODone (DESYREL) 50 MG tablet Take 1 tablet (50 mg total) by mouth at bedtime as needed for sleep. 05/01/21 05/07/21  Lenard Lance, FNP    Allergies    Patient has no known allergies.  Review of Systems   Review of Systems  Unable to perform ROS: Psychiatric disorder   Physical Exam Updated Vital Signs BP (!) 141/86 (BP Location: Left Arm)   Pulse 98   Temp 98.2 F (36.8 C) (Oral)   Resp (!) 25   Ht 6' (1.829 m)   Wt 97.5 kg   SpO2 97%   BMI 29.16 kg/m   Physical Exam Vitals and nursing note reviewed.  Constitutional:      General: He is not in acute distress.    Appearance: He is well-developed.  HENT:     Head: Normocephalic and atraumatic.  Eyes:     Extraocular Movements: Extraocular movements intact.  Cardiovascular:     Rate and Rhythm: Normal rate and regular rhythm.     Pulses: Normal pulses.  Pulmonary:     Effort: Pulmonary effort is normal.  Breath sounds: Normal breath sounds.  Abdominal:     General: There is no distension.     Palpations: Abdomen is soft.     Tenderness: There is no abdominal tenderness. There is no guarding.  Musculoskeletal:        General: Normal range of motion.     Cervical back: Normal range of motion.  Skin:    General: Skin is warm.     Capillary Refill: Capillary refill takes less than 2 seconds.     Findings: No rash.  Neurological:     Mental Status: He is alert and oriented to person, place, and time.  Psychiatric:        Speech: Speech is rapid and pressured.        Behavior: Behavior is hyperactive.     Comments: Denying SI/HI to me. Denies hallucinations.  Pt is hyperactive    ED Results / Procedures / Treatments   Labs (all labs ordered are listed, but only  abnormal results are displayed) Labs Reviewed  COMPREHENSIVE METABOLIC PANEL - Abnormal; Notable for the following components:      Result Value   Glucose, Bld 114 (*)    BUN 27 (*)    Total Protein 8.2 (*)    AST 92 (*)    ALT 84 (*)    Total Bilirubin 1.5 (*)    All other components within normal limits  SALICYLATE LEVEL - Abnormal; Notable for the following components:   Salicylate Lvl <7.0 (*)    All other components within normal limits  ACETAMINOPHEN LEVEL - Abnormal; Notable for the following components:   Acetaminophen (Tylenol), Serum <10 (*)    All other components within normal limits  RAPID URINE DRUG SCREEN, HOSP PERFORMED - Abnormal; Notable for the following components:   Opiates POSITIVE (*)    Amphetamines POSITIVE (*)    Tetrahydrocannabinol POSITIVE (*)    All other components within normal limits  RESP PANEL BY RT-PCR (FLU A&B, COVID) ARPGX2  ETHANOL  CBC    EKG EKG Interpretation  Date/Time:  Friday June 26 2021 05:24:09 EST Ventricular Rate:  79 PR Interval:  170 QRS Duration: 90 QT Interval:  391 QTC Calculation: 449 R Axis:   82 Text Interpretation: Sinus rhythm Biatrial enlargement Probable left ventricular hypertrophy Confirmed by Molpus, John ([id number]) on 06/26/2021 5:30:38 AM  Radiology No results found.  Procedures Procedures   Medications Ordered in ED Medications - No data to display  ED Course  I have reviewed the triage vital signs and the nursing notes.  Pertinent labs & imaging results that were available during my care of the patient were reviewed by me and considered in my medical decision making (see chart for details).    MDM Rules/Calculators/A&P                           Patient presenting under IVC for psychiatric evaluation.  On exam, patient is hyperactive with rapid and pressured speech.  Appears to be high.  He is denying SI or HI to me, however per IVC paperwork by girlfriend, patient has been making vague  comments about his death.  Will obtain screening labs for clearance and consult with psych.  Labs interpreted by me, overall reassuring.  UDS positive for multiple substances, likely contributing to his affect today.  Behavioral health team recommends patient remain in the ER until they can obtain collateral. Fist exam completed.   The patient has been  placed in psychiatric observation due to the need to provide a safe environment for the patient while obtaining psychiatric consultation and evaluation, as well as ongoing medical and medication management to treat the patient's condition.  The patient has been placed under full IVC at this time.   Final Clinical Impression(s) / ED Diagnoses Final diagnoses:  Encounter for psychological evaluation  Polysubstance use disorder    Rx / DC Orders ED Discharge Orders     None        Alveria Apley, PA-C 06/26/21 0613    Paula Libra, MD 06/26/21 [phone]

## 2021-06-26 NOTE — ED Provider Notes (Signed)
Patient cleared by psychiatry.  IVC rescinded.  Discharged in good condition.   Virgina Norfolk, DO 06/26/21 1330

## 2021-06-26 NOTE — BH Assessment (Signed)
Care Management - Follow Up Discharges   Writer attempted to make contact with patient today and was unsuccessful.  Writer left a HIPPA compliant voice message.   Per chart review, patient was provided with outpatient resources.   

## 2021-06-26 NOTE — ED Notes (Signed)
IVC paperwork [phone]

## 2021-06-26 NOTE — Consult Note (Signed)
Telepsych Consultation   Reason for Consult:  psych consult Referring Physician:  Alveria Apley PA-C Location of Patient: [place] [id number] Location of Provider: Behavioral [facility] TTS Department  Patient Identification: Nomar Broad MRN:  [id number] Principal Diagnosis: Substance-induced disorder [facility]) Diagnosis:  Principal Problem:   Substance-induced disorder (HCC) Active Problems:   Amphetamine abuse (HCC)   Opioid abuse (HCC)   Tetrahydrocannabinol (THC) use disorder, moderate, dependence (HCC)   Polysubstance abuse (HCC)   Total Time spent with patient: 20 minutes  Subjective:   Shell Yandow is a 25 y.o. male patient admitted via IVC for psychological evaluation.   EDRN note 06/26/21 0418:"Patient arrived in custody under IVC which states that he struggles with addiction and seen in the past for SI. Told significant other statements about spreading his ashes and she is concerned. Declining SI or HI at this time".  Patient presents laying in bed; oriented to person, place, situation; partial to time. States his wife bought him in "because she thought that I would hurt myself". States "nothing has really been going on. I made a few poor decisions. It just blew up in my face. I lied about where and when I got a certain drug (suboxone). That's what the fight was about. I slipped up and accidentally used 2 other drugs (heroin and meth) and I tried to lie about at first to her and then I ended up coming clean. So I talked to my wife after we talked and when I leave here i'm going to straight to her house, pack my stuff and go to dad's. Me and dad are going to try and find the most affordable rehab. She told me if I want my family back and if I want my life back to complete a program and I think it's time".  Reports last use x2 days ago. States he "wasn't high yesterday, I was just exhausted". States wife lied to magistrate telling them we were legally married; I call her my wife but we aren't  legally married. Says he was at a rehab [facility]) for 3 days and left x1 year ago.   He denies any suicidal or homicidal ideations, auditory or visual hallucinations, and does not appear to be psychotic, under the influence or responding to any external/internal stimuli. Contracts for safety.   Collateral: Donnella Sham [phone] girlfriend/petitioner [phone] no answer  HPI:  Earle Troiano is a 25 year old male with past history of polysubstance abuse including opioids, who presented to [facility] under IVC for psychological evaluation by girlfriend after reportedly making suicidal-like statements. UDS+ amphetamines, opioids, THC; BAL<8. PDMP reviewed; no active or history of prescriptions noted.   Past Psychiatric History: polysubstance abuse including opioids  Risk to Self:  pt denies Risk to Others:  pt denies Prior Inpatient Therapy:   Prior Outpatient Therapy:    Past Medical History: History reviewed. No pertinent past medical history. History reviewed. No pertinent surgical history. Family History:  Family History  Family history unknown: Yes   Family Psychiatric  History: not noted Social History:  Social History   Substance and Sexual Activity  Alcohol Use No     Social History   Substance and Sexual Activity  Drug Use Yes   Types: IV   Comment: heroin    Social History   Socioeconomic History   Marital status: Single    Spouse name: Not on file   Number of children: Not on file   Years of education: Not on file   Highest  education level: Not on file  Occupational History   Not on file  Tobacco Use   Smoking status: Former    Packs/day: 0.50    Types: Cigarettes   Smokeless tobacco: Never  Vaping Use   Vaping Use: Every day   Substances: Nicotine, Flavoring  Substance and Sexual Activity   Alcohol use: No   Drug use: Yes    Types: IV    Comment: heroin   Sexual activity: Never  Other Topics Concern   Not on file  Social History Narrative    Not on file   Social Determinants of Health   Financial Resource Strain: Not on file  Food Insecurity: Not on file  Transportation Needs: Not on file  Physical Activity: Not on file  Stress: Not on file  Social Connections: Not on file   Additional Social History:    Allergies:  No Known Allergies  Labs:  Results for orders placed or performed during the hospital encounter of 06/26/21 (from the past 48 hour(s))  Comprehensive metabolic panel     Status: Abnormal   Collection Time: 06/26/21  4:24 AM  Result Value Ref Range   Sodium 135 135 - 145 mmol/L   Potassium 3.9 3.5 - 5.1 mmol/L   Chloride 103 98 - 111 mmol/L   CO2 25 22 - 32 mmol/L   Glucose, Bld 114 (H) 70 - 99 mg/dL    Comment: Glucose reference range applies only to samples taken after fasting for at least 8 hours.   BUN 27 (H) 6 - 20 mg/dL   Creatinine, Ser 1.09 0.61 - 1.24 mg/dL   Calcium 9.4 8.9 - 32.3 mg/dL   Total Protein 8.2 (H) 6.5 - 8.1 g/dL   Albumin 4.6 3.5 - 5.0 g/dL   AST 92 (H) 15 - 41 U/L   ALT 84 (H) 0 - 44 U/L   Alkaline Phosphatase 69 38 - 126 U/L   Total Bilirubin 1.5 (H) 0.3 - 1.2 mg/dL   GFR, Estimated >55 >73 mL/min    Comment: (NOTE) Calculated using the CKD-EPI Creatinine Equation (2021)    Anion gap 7 5 - 15    Comment: Performed at [facility], [address] [place], [place] [address]  Ethanol     Status: None   Collection Time: 06/26/21  4:24 AM  Result Value Ref Range   Alcohol, Ethyl (B) <10 <10 mg/dL    Comment: (NOTE) Lowest detectable limit for serum alcohol is 10 mg/dL.  For medical purposes only. Performed at [facility], [address], [place], [place] [address]   Salicylate level     Status: Abnormal   Collection Time: 06/26/21  4:24 AM  Result Value Ref Range   Salicylate Lvl <7.0 (L) 7.0 - 30.0 mg/dL    Comment: Performed at [facility], [address], [place], [place] [address]  Acetaminophen level      Status: Abnormal   Collection Time: 06/26/21  4:24 AM  Result Value Ref Range   Acetaminophen (Tylenol), Serum <10 (L) 10 - 30 ug/mL    Comment: (NOTE) Therapeutic concentrations vary significantly. A range of 10-30 ug/mL  may be an effective concentration for many patients. However, some  are best treated at concentrations outside of this range. Acetaminophen concentrations >150 ug/mL at 4 hours after ingestion  and >50 ug/mL at 12 hours after ingestion are often associated with  toxic reactions.  Performed at [facility], [address] [place], [place] [address]   cbc     Status: None   Collection Time: 06/26/21  4:24 AM  Result Value Ref Range   WBC 8.7 4.0 - 10.5 K/uL   RBC 4.52 4.22 - 5.81 MIL/uL   Hemoglobin 13.4 13.0 - 17.0 g/dL   HCT 62.2 63.3 - 35.4 %   MCV 88.1 80.0 - 100.0 fL   MCH 29.6 26.0 - 34.0 pg   MCHC 33.7 30.0 - 36.0 g/dL   RDW 56.2 56.3 - 89.3 %   Platelets 192 150 - 400 K/uL   nRBC 0.0 0.0 - 0.2 %    Comment: Performed at [facility], [address], [place], [place] [address]  Rapid urine drug screen (hospital performed)     Status: Abnormal   Collection Time: 06/26/21  5:36 AM  Result Value Ref Range   Opiates POSITIVE (A) NONE DETECTED   Cocaine NONE DETECTED NONE DETECTED   Benzodiazepines NONE DETECTED NONE DETECTED   Amphetamines POSITIVE (A) NONE DETECTED   Tetrahydrocannabinol POSITIVE (A) NONE DETECTED   Barbiturates NONE DETECTED NONE DETECTED    Comment: (NOTE) DRUG SCREEN FOR MEDICAL PURPOSES ONLY.  IF CONFIRMATION IS NEEDED FOR ANY PURPOSE, NOTIFY LAB WITHIN 5 DAYS.  LOWEST DETECTABLE LIMITS FOR URINE DRUG SCREEN Drug Class                     Cutoff (ng/mL) Amphetamine and metabolites    1000 Barbiturate and metabolites    200 Benzodiazepine                 200 Tricyclics and metabolites     300 Opiates and metabolites        300 Cocaine and metabolites        300 THC                             50 Performed at [facility], [address] [place], [place] [address]   Resp Panel by RT-PCR (Flu A&B, Covid) Nasopharyngeal Swab     Status: None   Collection Time: 06/26/21  5:36 AM   Specimen: Nasopharyngeal Swab; Nasopharyngeal(NP) swabs in vial transport medium  Result Value Ref Range   SARS Coronavirus 2 by RT PCR NEGATIVE NEGATIVE    Comment: (NOTE) SARS-CoV-2 target nucleic acids are NOT DETECTED.  The SARS-CoV-2 RNA is generally detectable in upper respiratory specimens during the acute phase of infection. The lowest concentration of SARS-CoV-2 viral copies this assay can detect is 138 copies/mL. A negative result does not preclude SARS-Cov-2 infection and should not be used as the sole basis for treatment or other patient management decisions. A negative result may occur with  improper specimen collection/handling, submission of specimen other than nasopharyngeal swab, presence of viral mutation(s) within the areas targeted by this assay, and inadequate number of viral copies(<138 copies/mL). A negative result must be combined with clinical observations, patient history, and epidemiological information. The expected result is Negative.  Fact Sheet for Patients:  BloggerCourse.com  Fact Sheet for Healthcare Providers:  SeriousBroker.it  This test is no t yet approved or cleared by the [place] FDA and  has been authorized for detection and/or diagnosis of SARS-CoV-2 by FDA under an Emergency Use Authorization (EUA). This EUA will remain  in effect (meaning this test can be used) for the duration of the COVID-19 declaration under Section 564(b)(1) of the Act, 21  U.S.C.section 360bbb-3(b)(1), unless the authorization is terminated  or revoked sooner.       Influenza A by PCR NEGATIVE NEGATIVE   Influenza B by PCR NEGATIVE NEGATIVE    Comment: (NOTE) The Xpert Xpress  SARS-CoV-2/FLU/RSV plus assay is intended as an aid in the diagnosis of influenza from Nasopharyngeal swab specimens and should not be used as a sole basis for treatment. Nasal washings and aspirates are unacceptable for Xpert Xpress SARS-CoV-2/FLU/RSV testing.  Fact Sheet for Patients: BloggerCourse.com  Fact Sheet for Healthcare Providers: SeriousBroker.it  This test is not yet approved or cleared by the [place] FDA and has been authorized for detection and/or diagnosis of SARS-CoV-2 by FDA under an Emergency Use Authorization (EUA). This EUA will remain in effect (meaning this test can be used) for the duration of the COVID-19 declaration under Section 564(b)(1) of the Act, 21 U.S.C. section 360bbb-3(b)(1), unless the authorization is terminated or revoked.  Performed at [facility], [address], [place], [place] [address]     Medications:  No current facility-administered medications for this encounter.   Current Outpatient Medications  Medication Sig Dispense Refill   FLUoxetine (PROZAC) 20 MG capsule Take 1 capsule (20 mg total) by mouth daily. 30 capsule 0   hydrocerin (EUCERIN) CREA Apply 1 application topically daily as needed (For dry skin).     hydrOXYzine (ATARAX/VISTARIL) 25 MG tablet Take 1 tablet (25 mg total) by mouth 3 (three) times daily as needed for anxiety. 30 tablet 0    Musculoskeletal: Strength & Muscle Tone: within normal limits Gait & Station: normal Patient leans: N/A  Psychiatric Specialty Exam:  Presentation  General Appearance: Appropriate for Environment  Eye Contact:Good  Speech:Clear and Coherent  Speech Volume:Normal  Handedness:Right   Mood and Affect  Mood:Euthymic  Affect:Congruent; Appropriate   Thought Process  Thought Processes:Coherent; Goal Directed; Linear  Descriptions of Associations:Intact  Orientation:Partial (unable to state  specific date)  Thought Content:Logical; WDL  History of Schizophrenia/Schizoaffective disorder:No  Duration of Psychotic Symptoms:No data recorded Hallucinations:Hallucinations: None Ideas of Reference:None  Suicidal Thoughts:Suicidal Thoughts: No Homicidal Thoughts:Homicidal Thoughts: No  Sensorium  Memory:Immediate Fair; Recent Fair; Remote Fair  Judgment:Fair  Insight:Present   Executive Functions  Concentration:Good  Attention Span:Good  Recall:Good  Fund of Knowledge:Good  Language:Good   Psychomotor Activity  Psychomotor Activity:Psychomotor Activity: Normal  Assets  Assets:Communication Skills; Desire for Improvement; Financial Resources/Insurance; Housing; Intimacy; Leisure Time; Physical Health; Resilience; Social Support; Vocational/Educational   Sleep  Sleep:Sleep: Good   Physical Exam: Physical Exam Vitals and nursing note reviewed.  Constitutional:      General: He is not in acute distress.    Appearance: Normal appearance. He is normal weight. He is not ill-appearing or toxic-appearing.  HENT:     Head: Normocephalic.     Nose: Nose normal.     Mouth/Throat:     Mouth: Mucous membranes are moist.     Pharynx: Oropharynx is clear.  Eyes:     Pupils: Pupils are equal, round, and reactive to light.  Cardiovascular:     Rate and Rhythm: Normal rate.     Pulses: Normal pulses.  Pulmonary:     Effort: Pulmonary effort is normal.  Musculoskeletal:        General: Normal range of motion.     Cervical back: Normal range of motion.  Skin:    General: Skin is warm and dry.  Neurological:     Mental Status: He is alert and oriented to person,  place, and time. Mental status is at baseline.  Psychiatric:        Attention and Perception: He is attentive. He does not perceive auditory or visual hallucinations.        Mood and Affect: Mood and affect normal.        Speech: Speech normal.        Behavior: Behavior normal. Behavior is cooperative.         Thought Content: Thought content is not paranoid or delusional. Thought content does not include homicidal or suicidal ideation. Thought content does not include homicidal or suicidal plan.        Cognition and Memory: Cognition and memory normal.        Judgment: Judgment normal.   Review of Systems  Neurological:  Negative for dizziness, tingling, tremors, focal weakness, seizures, loss of consciousness, weakness and headaches.  Psychiatric/Behavioral:  Positive for substance abuse. Negative for depression, hallucinations, memory loss and suicidal ideas. The patient is not nervous/anxious and does not have insomnia.   All other systems reviewed and are negative. Blood pressure (!) 141/86, pulse 98, temperature 98.2 F (36.8 C), temperature source Oral, resp. rate (!) 25, height 6' (1.829 m), weight 97.5 kg, SpO2 97 %. Body mass index is 29.16 kg/m.  Treatment Plan Summary: Plan Discharge patient home with plan to follow up with outpatient psychiatric and substance abuse rehabilitation services. Patient is not suicidal, homicidal, or actively intoxicated.   Disposition: No evidence of imminent risk to self or others at present.   Patient does not meet criteria for psychiatric inpatient admission. Supportive therapy provided about ongoing stressors. Discussed crisis plan, support from social network, calling 911, coming to the Emergency Department, and calling Suicide Hotline. Resources for detox and residential substance abuse programs provided in AVS.   This service was provided via telemedicine using a 2-way, interactive audio and Immunologist.  Names of all persons participating in this telemedicine service and their role in this encounter. Name: Maxie Barb Role: PMHNP  Name: Nelly Rout Role: Attending MD  Name: [name] Role: patient  Name:  Role:     Loletta Parish, NP 06/26/2021 1:27 PM

## 2021-06-26 NOTE — ED Triage Notes (Signed)
Patient arrived in custody under IVC which states that he struggles with addiction and seen in the past for SI. Told significant other statements about spreading his ashes and she is concerned. Declining SI or HI at this time

## 2021-06-26 NOTE — BH Assessment (Signed)
Clinician messaged Holly R. Otis Brace, RN: "Hey. It's Trey with TTS. Is the pt able to be assessed? If so can you fax the pt's IVC paperwork it [phone]."   Clinician awaiting response.    Redmond Pulling, MS, [facility], [facility] Triage Specialist [phone]

## 2021-06-26 NOTE — BH Assessment (Signed)
[facility] Assessment Progress Note   Per Maxie Barb, NP , this pt does not require psychiatric hospitalization at this time.  Pt presents under IVC initiated by pt's girlfriend and upheld by EDP J. Brock Bad, MD, which has been rescinded by EDP Virgina Norfolk, DO.  Pt is psychiatrically cleared.  Discharge instructions include referral information for a variety of substance use disorder treatment providers.  Dr Lockie Mola and pt's nurse, Florentina Addison, have been notified.  Doylene Canning, MA Triage Specialist [phone]
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
## 2021-06-26 NOTE — ED Notes (Addendum)
Pt has one belonging bag. Pt belonging bag placed in triage nursing station cabinet.

## 2021-06-26 NOTE — ED Notes (Signed)
Patient belongings (1 bag) placed in cabinet behind 9-12 nursing station.

## 2021-06-26 NOTE — Discharge Instructions (Addendum)
To help you maintain a sober lifestyle, a substance use disorder treatment program may be beneficial to you.  Contact one of the following providers at your earliest opportunity to ask about enrolling their program:  MEDICAL DETOX:       [facility]      [address]      [place], [place] [address]      [phone]        [facility]       [address] [place]       [place], [place] [address]      [phone]        [facility]       [address] [place], [place] [address]       [phone]        [facility]       [address] [place], [place] [address]       [phone]       [facility]      [address]      [place], [place] [address]      [phone]  RESIDENTIAL REHAB:       [facility]      [address] [place], [place] [address]      [phone]       [facility]      [address]      [place], [place] [address]      [phone]       [facility]      [address] [place]      [place], [place] [address]      [phone]       [facility]      [address]      [place], [place] [address]      [phone]  OUTPATIENT PROGRAMS:       [facility]      [address]      [place], [place] [address]      [phone]      Offers substance use disorder counseling.  New patients are seen at their walk-in clinic.  Walk-in hours are Monday - Friday from 8:30 am - 12:00 pm, and from 1:00 pm - 2:30 pm.  Walk-in patients are seen on a first come, first served basis, so try to arrive as early as possible for the best chance of being seen the same day.  OPIOID REPLACEMENT PROVIDERS:       [facility]      [address], [place] [address]      [phone]       [facility]      [address] [place], [place] [address]      [phone]       [facility]      [address] [place], [place] [address]      [phone]  12 STEP PROGRAMS:       [facility] [place]      [email or web address]

## 2021-10-01 ENCOUNTER — Other Ambulatory Visit: Payer: Self-pay

## 2021-10-01 ENCOUNTER — Emergency Department (HOSPITAL_COMMUNITY)
Admission: EM | Admit: 2021-10-01 | Discharge: 2021-10-01 | Disposition: A | Discharge status: Home / Self Care | Payer: Medicaid Other | Attending: Emergency Medicine | Admitting: Emergency Medicine

## 2021-10-01 DIAGNOSIS — T402X1A Poisoning by other opioids, accidental (unintentional), initial encounter: Secondary | ICD-10-CM | POA: Insufficient documentation

## 2021-10-01 DIAGNOSIS — T50901A Poisoning by unspecified drugs, medicaments and biological substances, accidental (unintentional), initial encounter: Secondary | ICD-10-CM

## 2021-10-01 DIAGNOSIS — Z5321 Procedure and treatment not carried out due to patient leaving prior to being seen by health care provider: Secondary | ICD-10-CM | POA: Insufficient documentation

## 2021-10-01 DIAGNOSIS — R000 Tachycardia, unspecified: Secondary | ICD-10-CM | POA: Insufficient documentation

## 2021-10-01 DIAGNOSIS — R739 Hyperglycemia, unspecified: Secondary | ICD-10-CM | POA: Insufficient documentation

## 2021-10-01 NOTE — ED Provider Notes (Incomplete)
2mg  IN narcan. 30mg  oxycodone. Glucose 109, HR 120s. Alert, oriented.   AMA after discussion of risks of death, disability.

## 2021-10-01 NOTE — ED Notes (Signed)
Pt arrived via ems. A&O4 and ambulatory to room. Pt stated that he did not want to be here. Called MD to bedside. Spoke with pt about possible consequences of leaving. Pt refused to stay. PT walked out. NAD NARD. Refused vitals stating I dont want to waste anyones time.  ?

## 2021-10-01 NOTE — ED Triage Notes (Signed)
Pt took one "pressed roxy". Pt was unconscious upon ems arrival. Pt was given 2mg  narcan IN. Upon arrival to ER pt ambulatory to room and A&O4. NAD NARD. Denies any pain or issues.  ?

## 2021-10-03 NOTE — ED Provider Notes (Signed)
?  Physical Exam  ?SpO2 99%  ? ?Physical Exam ? ?Procedures  ?Procedures ? ?ED Course / MDM  ?  ?Medical Decision Making ? ?26yo male with history of IVDU, right knee infection, presents as an overdose.  He reports he took 30mg  of oxycodone for pain and has not used a needle/heroin in about 1 month.  He was found unresponsive outside. Was given 2mg  IN narcan by fire department and BVM used prior to EMS arrival, he became alert and immediately on arrival to the ED he is refusing care.  He is alert, oriented, and has the capacity to make his own decisions.  His glucose was 109 with EMS, was tachycardic to 120.  Declined any further emergency department evaluation or care.  Discussed risks of death and disability and he repeated this back to me. He left AMA prior to full evaluation. ? ? ? ? ?  ? , MD ?10/03/21 1810 ? ?

## 2022-06-22 IMAGING — DX DG CHEST 1V PORT
1 series · 1 of 1 positions shown · non-contrast
Comparison: 01/08/2021

CLINICAL DATA: Cat scratches on left arm and face, concern for
infection

EXAM:
PORTABLE CHEST 1 VIEW

[chest]
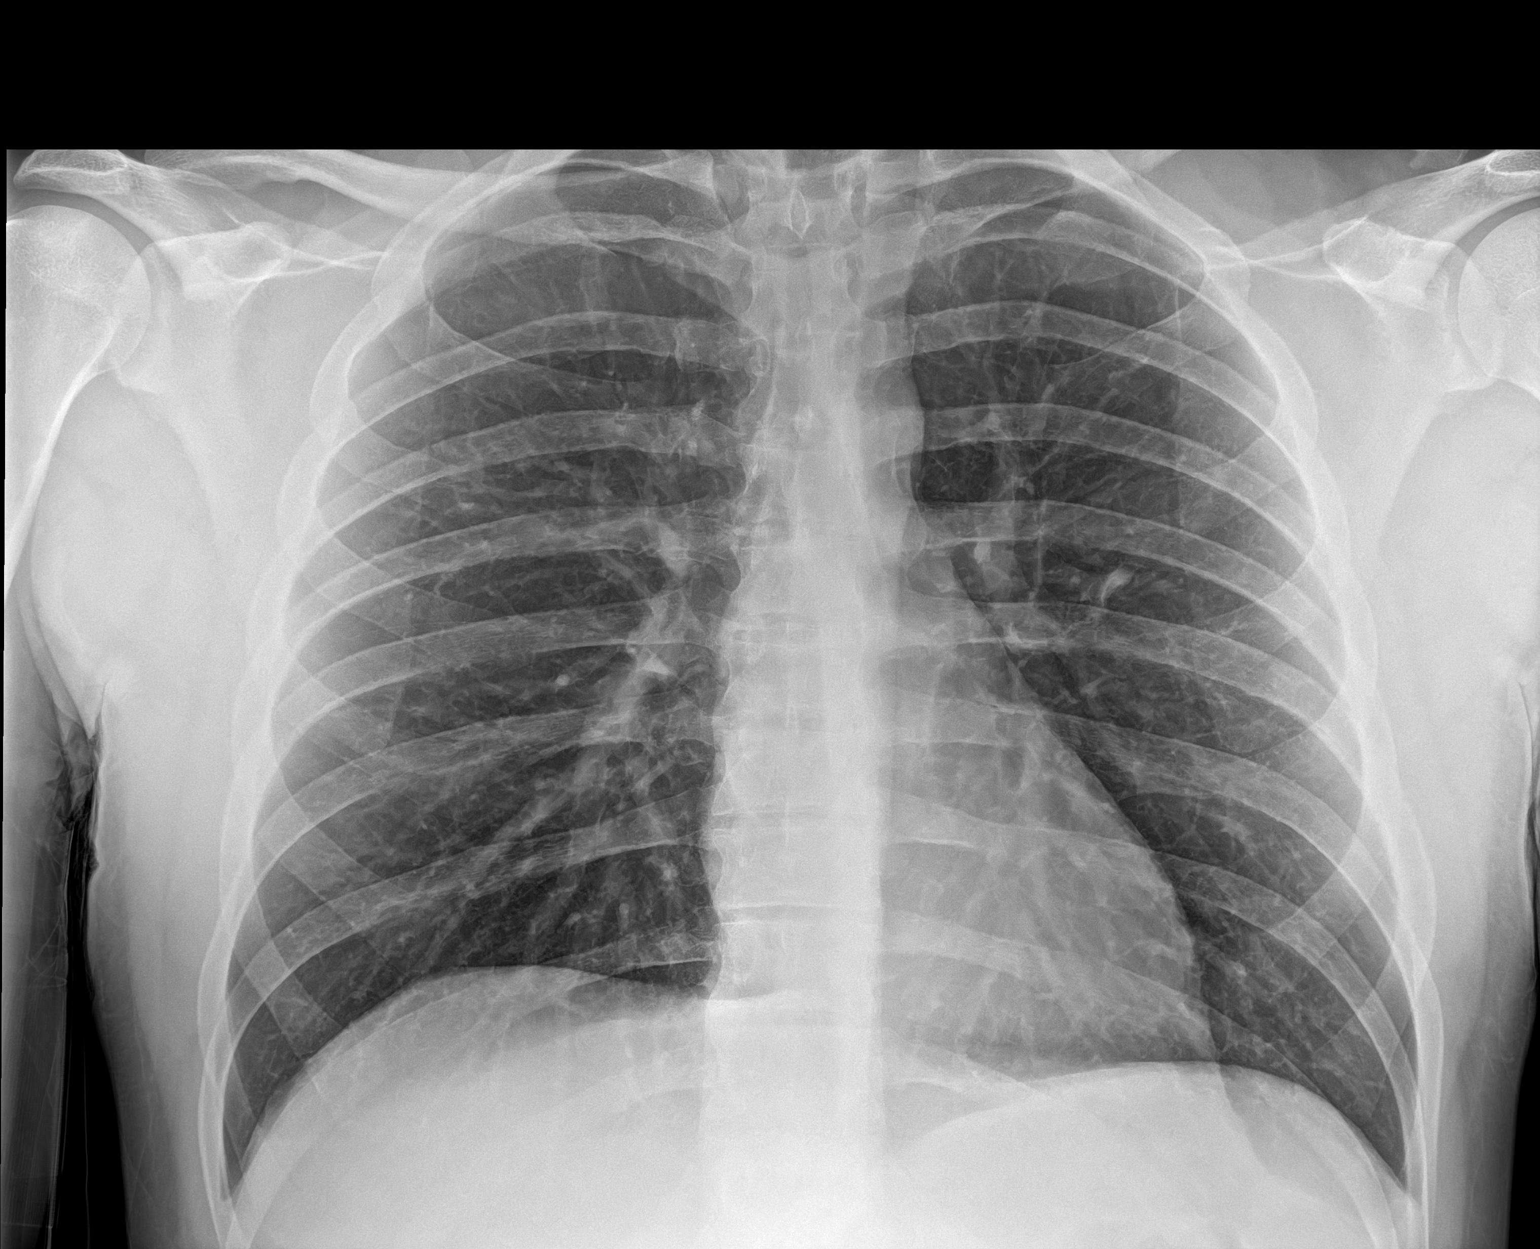

[1 of 1 positions shown; findings below may reference images not displayed]

FINDINGS: No consolidation, features of edema, pneumothorax, or effusion.
Pulmonary vascularity is normally distributed. The cardiomediastinal
contours are unremarkable. No acute osseous or soft tissue
abnormality.
IMPRESSION: No acute cardiopulmonary abnormality.
